# Patient Record
Sex: Female | Born: 1990 | Race: Black or African American | Hispanic: No | Marital: Single | Smoking: Current every day smoker
Health system: Southern US, Community
[De-identification: ages and names within clinical notes are randomized; demographics above are authoritative.]

## PROBLEM LIST (undated history)

## (undated) ENCOUNTER — Inpatient Hospital Stay (HOSPITAL_COMMUNITY): Payer: Self-pay

## (undated) ENCOUNTER — Emergency Department (HOSPITAL_COMMUNITY): Admission: EM | Payer: Medicaid Other | Source: Home / Self Care

## (undated) DIAGNOSIS — Z789 Other specified health status: Secondary | ICD-10-CM

## (undated) DIAGNOSIS — N920 Excessive and frequent menstruation with regular cycle: Secondary | ICD-10-CM

## (undated) DIAGNOSIS — N888 Other specified noninflammatory disorders of cervix uteri: Secondary | ICD-10-CM

## (undated) DIAGNOSIS — F1910 Other psychoactive substance abuse, uncomplicated: Secondary | ICD-10-CM

## (undated) HISTORY — PX: DILATION AND CURETTAGE OF UTERUS: SHX78

## (undated) HISTORY — PX: NO PAST SURGERIES: SHX2092

---
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
## 2008-03-11 ENCOUNTER — Ambulatory Visit: Payer: Self-pay | Admitting: Obstetrics

## 2008-04-25 ENCOUNTER — Emergency Department (HOSPITAL_COMMUNITY): Admission: EM | Admit: 2008-04-25 | Discharge: 2008-04-25 | Payer: Self-pay | Admitting: Emergency Medicine

## 2008-07-17 ENCOUNTER — Emergency Department (HOSPITAL_COMMUNITY): Admission: EM | Admit: 2008-07-17 | Discharge: 2008-07-17 | Payer: Self-pay | Admitting: Emergency Medicine

## 2008-08-21 ENCOUNTER — Emergency Department (HOSPITAL_COMMUNITY): Admission: EM | Admit: 2008-08-21 | Discharge: 2008-08-22 | Payer: Self-pay | Admitting: Emergency Medicine

## 2008-10-13 ENCOUNTER — Emergency Department (HOSPITAL_COMMUNITY): Admission: EM | Admit: 2008-10-13 | Discharge: 2008-10-13 | Payer: Self-pay | Admitting: Emergency Medicine

## 2009-08-10 ENCOUNTER — Emergency Department (HOSPITAL_COMMUNITY): Admission: EM | Admit: 2009-08-10 | Discharge: 2009-08-11 | Payer: Self-pay | Admitting: Emergency Medicine

## 2009-10-09 ENCOUNTER — Emergency Department (HOSPITAL_COMMUNITY): Admission: EM | Admit: 2009-10-09 | Discharge: 2009-10-09 | Payer: Self-pay | Admitting: Emergency Medicine

## 2010-02-02 ENCOUNTER — Emergency Department (HOSPITAL_COMMUNITY)
Admission: EM | Admit: 2010-02-02 | Discharge: 2010-02-02 | Payer: Self-pay | Source: Home / Self Care | Admitting: Emergency Medicine

## 2010-02-03 LAB — URINALYSIS, ROUTINE W REFLEX MICROSCOPIC
Bilirubin Urine: NEGATIVE
Ketones, ur: NEGATIVE mg/dL
Leukocytes, UA: NEGATIVE
Nitrite: NEGATIVE
Protein, ur: NEGATIVE mg/dL
Specific Gravity, Urine: 1.005 (ref 1.005–1.030)
Urine Glucose, Fasting: NEGATIVE mg/dL
Urobilinogen, UA: 0.2 mg/dL (ref 0.0–1.0)
pH: 7.5 (ref 5.0–8.0)

## 2010-02-03 LAB — BASIC METABOLIC PANEL
BUN: 6 mg/dL (ref 6–23)
CO2: 26 mEq/L (ref 19–32)
Calcium: 9.2 mg/dL (ref 8.4–10.5)
Chloride: 108 mEq/L (ref 96–112)
Creatinine, Ser: 0.67 mg/dL (ref 0.4–1.2)
GFR calc Af Amer: >60 mL/min (ref >60)
GFR calc non Af Amer: >60 mL/min (ref >60)
Glucose, Bld: 86 mg/dL (ref 70–99)
Potassium: 3.9 mEq/L (ref 3.5–5.1)
Sodium: 140 mEq/L (ref 135–145)

## 2010-02-03 LAB — CBC
HCT: 32.6 % — ABNORMAL LOW (ref 36.0–46.0)
Hemoglobin: 10 g/dL — ABNORMAL LOW (ref 12.0–15.0)
MCH: 24.8 pg — ABNORMAL LOW (ref 26.0–34.0)
MCHC: 30.7 g/dL (ref 30.0–36.0)
MCV: 80.7 fL (ref 78.0–100.0)
Platelets: 329 10*3/uL (ref 150–400)
RBC: 4.04 MIL/uL (ref 3.87–5.11)
RDW: 15.3 % (ref 11.5–15.5)
WBC: 7.6 10*3/uL (ref 4.0–10.5)

## 2010-02-03 LAB — DIFFERENTIAL
Basophils Absolute: 0 10*3/uL (ref 0.0–0.1)
Basophils Relative: 0 % (ref 0–1)
Eosinophils Absolute: 0.1 10*3/uL (ref 0.0–0.7)
Eosinophils Relative: 1 % (ref 0–5)
Lymphocytes Relative: 35 % (ref 12–46)
Lymphs Abs: 2.6 10*3/uL (ref 0.7–4.0)
Monocytes Absolute: 0.4 10*3/uL (ref 0.1–1.0)
Monocytes Relative: 6 % (ref 3–12)
Neutro Abs: 4.5 10*3/uL (ref 1.7–7.7)
Neutrophils Relative %: 59 % (ref 43–77)

## 2010-02-03 LAB — HCG, QUANTITATIVE, PREGNANCY: hCG, Beta Chain, Quant, S: 2 m[IU]/mL (ref <5)

## 2010-02-03 LAB — URINE MICROSCOPIC-ADD ON

## 2010-02-03 LAB — PREGNANCY, URINE: Preg Test, Ur: NEGATIVE

## 2010-04-03 LAB — URINE MICROSCOPIC-ADD ON

## 2010-04-03 LAB — COMPREHENSIVE METABOLIC PANEL
ALT: 16 U/L (ref 0–35)
AST: 22 U/L (ref 0–37)
Albumin: 4.1 g/dL (ref 3.5–5.2)
Alkaline Phosphatase: 56 U/L (ref 39–117)
BUN: 7 mg/dL (ref 6–23)
CO2: 23 mEq/L (ref 19–32)
Calcium: 9 mg/dL (ref 8.4–10.5)
Chloride: 104 mEq/L (ref 96–112)
Creatinine, Ser: 0.71 mg/dL (ref 0.4–1.2)
GFR calc Af Amer: >60 mL/min (ref >60)
GFR calc non Af Amer: >60 mL/min (ref >60)
Glucose, Bld: 89 mg/dL (ref 70–99)
Potassium: 3.3 mEq/L — ABNORMAL LOW (ref 3.5–5.1)
Sodium: 135 mEq/L (ref 135–145)
Total Bilirubin: 0.6 mg/dL (ref 0.3–1.2)
Total Protein: 7.6 g/dL (ref 6.0–8.3)

## 2010-04-03 LAB — CBC
HCT: 38.5 % (ref 36.0–46.0)
Hemoglobin: 12.9 g/dL (ref 12.0–15.0)
MCH: 29.2 pg (ref 26.0–34.0)
MCHC: 33.6 g/dL (ref 30.0–36.0)
MCV: 87 fL (ref 78.0–100.0)
Platelets: 298 10*3/uL (ref 150–400)
RBC: 4.43 MIL/uL (ref 3.87–5.11)
RDW: 13.8 % (ref 11.5–15.5)
WBC: 12.6 10*3/uL — ABNORMAL HIGH (ref 4.0–10.5)

## 2010-04-03 LAB — DIFFERENTIAL
Basophils Absolute: 0 10*3/uL (ref 0.0–0.1)
Basophils Relative: 0 % (ref 0–1)
Eosinophils Absolute: 0 10*3/uL (ref 0.0–0.7)
Eosinophils Relative: 0 % (ref 0–5)
Lymphocytes Relative: 9 % — ABNORMAL LOW (ref 12–46)
Lymphs Abs: 1.1 10*3/uL (ref 0.7–4.0)
Monocytes Absolute: 0.9 10*3/uL (ref 0.1–1.0)
Monocytes Relative: 7 % (ref 3–12)
Neutro Abs: 10.6 10*3/uL — ABNORMAL HIGH (ref 1.7–7.7)
Neutrophils Relative %: 84 % — ABNORMAL HIGH (ref 43–77)

## 2010-04-03 LAB — URINALYSIS, ROUTINE W REFLEX MICROSCOPIC
Glucose, UA: NEGATIVE mg/dL
Hgb urine dipstick: NEGATIVE
Ketones, ur: NEGATIVE mg/dL
Leukocytes, UA: NEGATIVE
Nitrite: NEGATIVE
Protein, ur: 30 mg/dL — AB
Specific Gravity, Urine: 1.03 (ref 1.005–1.030)
Urobilinogen, UA: 0.2 mg/dL (ref 0.0–1.0)
pH: 5.5 (ref 5.0–8.0)

## 2010-04-03 LAB — LIPASE, BLOOD: Lipase: 18 U/L (ref 11–59)

## 2010-04-03 LAB — POCT PREGNANCY, URINE: Preg Test, Ur: NEGATIVE

## 2010-04-23 LAB — POCT I-STAT, CHEM 8
BUN: 7 mg/dL (ref 6–23)
Calcium, Ion: 1.14 mmol/L (ref 1.12–1.32)
Chloride: 107 mEq/L (ref 96–112)
Creatinine, Ser: 0.7 mg/dL (ref 0.4–1.2)
Glucose, Bld: 84 mg/dL (ref 70–99)
HCT: 37 % (ref 36.0–46.0)
Hemoglobin: 12.6 g/dL (ref 12.0–15.0)
Potassium: 3.9 mEq/L (ref 3.5–5.1)
Sodium: 142 mEq/L (ref 135–145)
TCO2: 26 mmol/L (ref 0–100)

## 2010-04-23 LAB — COMPREHENSIVE METABOLIC PANEL
ALT: 29 U/L (ref 0–35)
AST: 24 U/L (ref 0–37)
Albumin: 3.8 g/dL (ref 3.5–5.2)
Alkaline Phosphatase: 71 U/L (ref 39–117)
BUN: 7 mg/dL (ref 6–23)
CO2: 28 mEq/L (ref 19–32)
Calcium: 9.2 mg/dL (ref 8.4–10.5)
Chloride: 108 mEq/L (ref 96–112)
Creatinine, Ser: 0.65 mg/dL (ref 0.4–1.2)
GFR calc Af Amer: >60 mL/min (ref >60)
GFR calc non Af Amer: >60 mL/min (ref >60)
Glucose, Bld: 82 mg/dL (ref 70–99)
Potassium: 4 mEq/L (ref 3.5–5.1)
Sodium: 141 mEq/L (ref 135–145)
Total Bilirubin: 0.2 mg/dL — ABNORMAL LOW (ref 0.3–1.2)
Total Protein: 7.6 g/dL (ref 6.0–8.3)

## 2010-04-23 LAB — URINALYSIS, ROUTINE W REFLEX MICROSCOPIC
Bilirubin Urine: NEGATIVE
Glucose, UA: NEGATIVE mg/dL
Hgb urine dipstick: NEGATIVE
Ketones, ur: NEGATIVE mg/dL
Leukocytes, UA: NEGATIVE
Nitrite: NEGATIVE
Protein, ur: 30 mg/dL — AB
Specific Gravity, Urine: 1.028 (ref 1.005–1.030)
Urobilinogen, UA: 0.2 mg/dL (ref 0.0–1.0)
pH: 8 (ref 5.0–8.0)

## 2010-04-23 LAB — URINE MICROSCOPIC-ADD ON

## 2010-04-23 LAB — LIPASE, BLOOD: Lipase: 19 U/L (ref 11–59)

## 2010-04-23 LAB — POCT PREGNANCY, URINE: Preg Test, Ur: NEGATIVE

## 2010-04-24 LAB — POCT PREGNANCY, URINE: Preg Test, Ur: NEGATIVE

## 2010-04-24 LAB — CBC
MCV: 83.2 fL (ref 78.0–100.0)
RBC: 4.03 MIL/uL (ref 3.87–5.11)
WBC: 19.5 10*3/uL — ABNORMAL HIGH (ref 4.0–10.5)

## 2010-04-24 LAB — URINALYSIS, ROUTINE W REFLEX MICROSCOPIC
Glucose, UA: NEGATIVE mg/dL
Ketones, ur: NEGATIVE mg/dL
pH: 6 (ref 5.0–8.0)

## 2010-04-24 LAB — BASIC METABOLIC PANEL
Chloride: 105 mEq/L (ref 96–112)
Creatinine, Ser: 0.54 mg/dL (ref 0.4–1.2)
GFR calc Af Amer: >60 mL/min (ref >60)
Potassium: 3.3 mEq/L — ABNORMAL LOW (ref 3.5–5.1)
Sodium: 135 mEq/L (ref 135–145)

## 2010-04-25 LAB — URINALYSIS, ROUTINE W REFLEX MICROSCOPIC
Hgb urine dipstick: NEGATIVE
Nitrite: NEGATIVE
Specific Gravity, Urine: 1.021 (ref 1.005–1.030)
Urobilinogen, UA: 0.2 mg/dL (ref 0.0–1.0)

## 2010-04-25 LAB — POCT PREGNANCY, URINE: Preg Test, Ur: NEGATIVE

## 2010-04-28 LAB — URINALYSIS, ROUTINE W REFLEX MICROSCOPIC
Ketones, ur: NEGATIVE mg/dL
Nitrite: NEGATIVE
Protein, ur: NEGATIVE mg/dL

## 2010-08-01 ENCOUNTER — Emergency Department (HOSPITAL_COMMUNITY)
Admission: EM | Admit: 2010-08-01 | Discharge: 2010-08-01 | Disposition: A | Discharge status: Home / Self Care | Payer: Self-pay | Attending: Emergency Medicine | Admitting: Emergency Medicine

## 2010-08-01 DIAGNOSIS — O039 Complete or unspecified spontaneous abortion without complication: Secondary | ICD-10-CM | POA: Insufficient documentation

## 2010-08-01 DIAGNOSIS — N898 Other specified noninflammatory disorders of vagina: Secondary | ICD-10-CM | POA: Insufficient documentation

## 2010-08-01 DIAGNOSIS — J45909 Unspecified asthma, uncomplicated: Secondary | ICD-10-CM | POA: Insufficient documentation

## 2010-08-01 LAB — URINALYSIS, ROUTINE W REFLEX MICROSCOPIC
Bilirubin Urine: NEGATIVE
Hgb urine dipstick: NEGATIVE
Ketones, ur: 15 mg/dL — AB
Nitrite: NEGATIVE
Urobilinogen, UA: 0.2 mg/dL (ref 0.0–1.0)
pH: 6 (ref 5.0–8.0)

## 2010-08-01 LAB — CBC
MCH: 25.1 pg — ABNORMAL LOW (ref 26.0–34.0)
MCV: 77.3 fL — ABNORMAL LOW (ref 78.0–100.0)
Platelets: 369 10*3/uL (ref 150–400)
RDW: 16.6 % — ABNORMAL HIGH (ref 11.5–15.5)

## 2010-09-07 ENCOUNTER — Emergency Department (HOSPITAL_COMMUNITY)
Admission: EM | Admit: 2010-09-07 | Discharge: 2010-09-07 | Disposition: A | Discharge status: Home / Self Care | Payer: Self-pay | Attending: Emergency Medicine | Admitting: Emergency Medicine

## 2010-09-07 DIAGNOSIS — K089 Disorder of teeth and supporting structures, unspecified: Secondary | ICD-10-CM | POA: Insufficient documentation

## 2010-09-07 DIAGNOSIS — R509 Fever, unspecified: Secondary | ICD-10-CM | POA: Insufficient documentation

## 2010-09-07 DIAGNOSIS — R110 Nausea: Secondary | ICD-10-CM | POA: Insufficient documentation

## 2010-09-07 LAB — BASIC METABOLIC PANEL WITH GFR
BUN: 7 mg/dL (ref 6–23)
CO2: 29 meq/L (ref 19–32)
Calcium: 9.5 mg/dL (ref 8.4–10.5)
Chloride: 99 meq/L (ref 96–112)
Creatinine, Ser: 0.6 mg/dL (ref 0.50–1.10)
GFR calc Af Amer: >60 mL/min
GFR calc non Af Amer: >60 mL/min
Glucose, Bld: 98 mg/dL (ref 70–99)
Potassium: 3.7 meq/L (ref 3.5–5.1)
Sodium: 135 meq/L (ref 135–145)

## 2010-09-07 LAB — CBC
HCT: 32.8 % — ABNORMAL LOW (ref 36.0–46.0)
MCH: 25.5 pg — ABNORMAL LOW (ref 26.0–34.0)
MCV: 81.2 fL (ref 78.0–100.0)
Platelets: 315 10*3/uL (ref 150–400)
RBC: 4.04 MIL/uL (ref 3.87–5.11)

## 2010-09-07 LAB — DIFFERENTIAL
Eosinophils Absolute: 0.1 10*3/uL (ref 0.0–0.7)
Lymphocytes Relative: 39 % (ref 12–46)
Lymphs Abs: 3.4 10*3/uL (ref 0.7–4.0)
Monocytes Relative: 7 % (ref 3–12)
Neutrophils Relative %: 52 % (ref 43–77)

## 2010-09-18 ENCOUNTER — Emergency Department (HOSPITAL_COMMUNITY)
Admission: EM | Admit: 2010-09-18 | Discharge: 2010-09-18 | Disposition: A | Discharge status: Home / Self Care | Payer: Self-pay | Attending: Emergency Medicine | Admitting: Emergency Medicine

## 2010-09-18 DIAGNOSIS — F1910 Other psychoactive substance abuse, uncomplicated: Secondary | ICD-10-CM | POA: Insufficient documentation

## 2010-09-18 DIAGNOSIS — R4182 Altered mental status, unspecified: Secondary | ICD-10-CM | POA: Insufficient documentation

## 2010-09-18 DIAGNOSIS — F1010 Alcohol abuse, uncomplicated: Secondary | ICD-10-CM | POA: Insufficient documentation

## 2010-09-18 LAB — RAPID URINE DRUG SCREEN, HOSP PERFORMED
Amphetamines: NOT DETECTED
Barbiturates: NOT DETECTED
Benzodiazepines: POSITIVE — AB
Tetrahydrocannabinol: POSITIVE — AB

## 2010-09-18 LAB — CBC
HCT: 30.4 % — ABNORMAL LOW (ref 36.0–46.0)
Hemoglobin: 9.7 g/dL — ABNORMAL LOW (ref 12.0–15.0)
MCH: 25.7 pg — ABNORMAL LOW (ref 26.0–34.0)
MCHC: 31.9 g/dL (ref 30.0–36.0)
RBC: 3.78 MIL/uL — ABNORMAL LOW (ref 3.87–5.11)

## 2010-09-18 LAB — COMPREHENSIVE METABOLIC PANEL
ALT: 11 U/L (ref 0–35)
AST: 17 U/L (ref 0–37)
Albumin: 3.2 g/dL — ABNORMAL LOW (ref 3.5–5.2)
Alkaline Phosphatase: 41 U/L (ref 39–117)
Calcium: 7.7 mg/dL — ABNORMAL LOW (ref 8.4–10.5)
Potassium: 3.4 mEq/L — ABNORMAL LOW (ref 3.5–5.1)
Sodium: 142 mEq/L (ref 135–145)
Total Protein: 6.4 g/dL (ref 6.0–8.3)

## 2010-09-18 LAB — POCT PREGNANCY, URINE: Preg Test, Ur: NEGATIVE

## 2011-01-18 NOTE — L&D Delivery Note (Signed)
Delivery Note At 2:02 AM a viable female was delivered via Vaginal, Spontaneous Delivery (Presentation:Vertex, ROA).  APGAR:8 ,9 ; weight is pending .   Placenta status: spontaneous, intact .  Cord: 3 vessels, with the following complications: none  Anesthesia: Epidural  Episiotomy: none Lacerations: none Est. Blood Loss (mL): 150  Mom to postpartum.  Baby to nursery-stable.  Ginger Organ E 01/03/2012, 2:18 AM

## 2011-01-18 NOTE — L&D Delivery Note (Signed)
I was present for entire delivery and agree with above note.  Napoleon Form, MD

## 2011-04-02 ENCOUNTER — Emergency Department (HOSPITAL_COMMUNITY)
Admission: EM | Admit: 2011-04-02 | Discharge: 2011-04-02 | Disposition: A | Discharge status: Home / Self Care | Payer: Self-pay | Attending: Emergency Medicine | Admitting: Emergency Medicine

## 2011-04-02 ENCOUNTER — Encounter (HOSPITAL_COMMUNITY): Payer: Self-pay | Admitting: *Deleted

## 2011-04-02 DIAGNOSIS — F17200 Nicotine dependence, unspecified, uncomplicated: Secondary | ICD-10-CM | POA: Insufficient documentation

## 2011-04-02 DIAGNOSIS — S61519A Laceration without foreign body of unspecified wrist, initial encounter: Secondary | ICD-10-CM

## 2011-04-02 DIAGNOSIS — Y998 Other external cause status: Secondary | ICD-10-CM | POA: Insufficient documentation

## 2011-04-02 DIAGNOSIS — Y9389 Activity, other specified: Secondary | ICD-10-CM | POA: Insufficient documentation

## 2011-04-02 DIAGNOSIS — S61509A Unspecified open wound of unspecified wrist, initial encounter: Secondary | ICD-10-CM | POA: Insufficient documentation

## 2011-04-02 DIAGNOSIS — W278XXA Contact with other nonpowered hand tool, initial encounter: Secondary | ICD-10-CM | POA: Insufficient documentation

## 2011-04-02 MED ORDER — TRAMADOL HCL 50 MG PO TABS
50.0000 mg | ORAL_TABLET | Freq: Once | ORAL | Status: AC
  Administered 2011-04-02: 50 mg via ORAL
  Filled 2011-04-02: qty 1

## 2011-04-02 MED ORDER — HYDROCODONE-ACETAMINOPHEN 5-500 MG PO TABS: 1.0000 | ORAL_TABLET | Freq: Four times a day (QID) | ORAL | Status: AC | PRN

## 2011-04-02 NOTE — ED Notes (Signed)
Reports jumping on her bed last night and accidentally got a cut on left anterior wrist from a pair of scissors that were on the bed. Had called ems at the time and told that it didn't need tx, bandaged pta. No bleeding noted at triage.

## 2011-04-02 NOTE — Discharge Instructions (Signed)
Laceration Care, Adult A laceration is a cut that goes through all layers of the skin. The cut goes into the tissue beneath the skin. HOME CARE For stitches (sutures) or staples:  Keep the cut clean and dry.   If you have a bandage (dressing), change it at least once a day. Change the bandage if it gets wet or dirty, or as told by your doctor.   Wash the cut with soap and water 2 times a day. Rinse the cut with water. Pat it dry with a clean towel.   Put a thin layer of medicated cream on the cut as told by your doctor.   You may shower after the first 24 hours. Do not soak the cut in water until the stitches are removed.   Only take medicines as told by your doctor.   Have your stitches or staples removed as told by your doctor.  For skin adhesive strips:  Keep the cut clean and dry.   Do not get the strips wet. You may take a bath, but be careful to keep the cut dry.   If the cut gets wet, pat it dry with a clean towel.   The strips will fall off on their own. Do not remove the strips that are still stuck to the cut.  For wound glue:  You may shower or take baths. Do not soak or scrub the cut. Do not swim. Avoid heavy sweating until the glue falls off on its own. After a shower or bath, pat the cut dry with a clean towel.   Do not put medicine on your cut until the glue falls off.   If you have a bandage, do not put tape over the glue.   Avoid lots of sunlight or tanning lamps until the glue falls off. Put sunscreen on the cut for the first year to reduce your scar.   The glue will fall off on its own. Do not pick at the glue.  You may need a tetanus shot if:  You cannot remember when you had your last tetanus shot.   You have never had a tetanus shot.  If you need a tetanus shot and you choose not to have one, you may get tetanus. Sickness from tetanus can be serious. GET HELP RIGHT AWAY IF:   Your pain does not get better with medicine.   Your arm, hand, leg, or  foot loses feeling (numbness) or changes color.   Your cut is bleeding.   Your joint feels weak, or you cannot use your joint.   You have painful lumps on your body.   Your cut is red, puffy (swollen), or painful.   You have a red line on the skin near the cut.   You have yellowish-white fluid (pus) coming from the cut.   You have a fever.   You have a bad smell coming from the cut or bandage.   Your cut breaks open before or after stitches are removed.   You notice something coming out of the cut, such as wood or glass.   You cannot move a finger or toe.  MAKE SURE YOU:   Understand these instructions.   Will watch your condition.   Will get help right away if you are not doing well or get worse.  Document Released: 06/22/2007 Document Revised: 12/23/2010 Document Reviewed: 06/29/2010 [facility] Patient Information 2012 [place], [place].     Sutures out in 7 days.

## 2011-04-02 NOTE — ED Provider Notes (Signed)
History     CSN: [id number]  Arrival date & time 04/02/11  1133   First MD Initiated Contact with Patient 04/02/11 1204      Chief Complaint  Patient presents with  . Laceration    (Consider location/radiation/quality/duration/timing/severity/associated sxs/prior treatment) Patient is a 21 y.o. female presenting with skin laceration. The history is provided by the patient.  Laceration  Incident onset: at 1 AM. Pain location: left wrist. The laceration is 3 cm in size. Injury mechanism: States was jumping on the bed and landed on a pair of scissors, causing two lacerations.   The pain is severe. The pain has been constant since onset. She reports no foreign bodies present. Her tetanus status is unknown.    History reviewed. No pertinent past medical history.  History reviewed. No pertinent past surgical history.  History reviewed. No pertinent family history.  History  Substance Use Topics  . Smoking status: Current Everyday Smoker -- 1.0 packs/day    Types: Cigarettes  . Smokeless tobacco: Not on file  . Alcohol Use: Yes     occ    OB History    Grav Para Term Preterm Abortions TAB SAB Ect Mult Living                  Review of Systems  All other systems reviewed and are negative.    Allergies  Review of patient's allergies indicates no known allergies.  Home Medications  No current outpatient prescriptions on file.  BP 110/64  Pulse 87  Temp(Src) 98.7 F (37.1 C) (Oral)  Resp 20  SpO2 100%  LMP 03/18/2011  Physical Exam  Nursing note and vitals reviewed. Constitutional: She is oriented to person, place, and time. She appears well-developed and well-nourished.  HENT:  Head: Normocephalic and atraumatic.  Neck: Normal range of motion. Neck supple.  Musculoskeletal:       There are two lacerations to the left wrist.  The proximal laceration is superficial and does not require repair.  The more distal extends into the subcutaneous tissue.  There is no  arterial, tendon, or nerve involvement.  Neurological: She is alert and oriented to person, place, and time.  Skin: Skin is warm and dry.    ED Course  Procedures (including critical care time)  Labs Reviewed - No data to display No results found.   No diagnosis found.  LACERATION REPAIR Performed by: Geoffery Lyons Authorized by: Geoffery Lyons Consent: Verbal consent obtained. Risks and benefits: risks, benefits and alternatives were discussed Consent given by: patient Patient identity confirmed: provided demographic data Prepped and Draped in normal sterile fashion Wound explored  Laceration Location: Left wrist  Laceration Length: 3.5 cm  No Foreign Bodies seen or palpated  Anesthesia: local infiltration  Local anesthetic: lidocaine 2% without epinephrine  Anesthetic total: 3 ml  Irrigation method: syringe Amount of cleaning: standard  Skin closure: 5-0 prolene  Number of sutures: 5  Technique: simple interrupted.  Patient tolerance: Patient tolerated the procedure well with no immediate complications.  MDM          Geoffery Lyons, MD 04/02/11 1246

## 2011-09-08 ENCOUNTER — Inpatient Hospital Stay (HOSPITAL_COMMUNITY): Payer: Self-pay

## 2011-09-08 ENCOUNTER — Encounter (HOSPITAL_COMMUNITY): Payer: Self-pay | Admitting: Emergency Medicine

## 2011-09-08 ENCOUNTER — Inpatient Hospital Stay (HOSPITAL_COMMUNITY)
Admission: AD | Admit: 2011-09-08 | Discharge: 2011-09-08 | Disposition: A | Discharge status: Home / Self Care | Payer: Self-pay | Source: Ambulatory Visit | Attending: Obstetrics & Gynecology | Admitting: Obstetrics & Gynecology

## 2011-09-08 ENCOUNTER — Emergency Department (HOSPITAL_COMMUNITY)
Admission: EM | Admit: 2011-09-08 | Discharge: 2011-09-08 | Disposition: A | Discharge status: Home / Self Care | Payer: Self-pay | Attending: Emergency Medicine | Admitting: Emergency Medicine

## 2011-09-08 ENCOUNTER — Encounter (HOSPITAL_COMMUNITY): Payer: Self-pay | Admitting: *Deleted

## 2011-09-08 DIAGNOSIS — N949 Unspecified condition associated with female genital organs and menstrual cycle: Secondary | ICD-10-CM | POA: Insufficient documentation

## 2011-09-08 DIAGNOSIS — K029 Dental caries, unspecified: Secondary | ICD-10-CM | POA: Insufficient documentation

## 2011-09-08 DIAGNOSIS — A499 Bacterial infection, unspecified: Secondary | ICD-10-CM

## 2011-09-08 DIAGNOSIS — M549 Dorsalgia, unspecified: Secondary | ICD-10-CM | POA: Insufficient documentation

## 2011-09-08 DIAGNOSIS — O99891 Other specified diseases and conditions complicating pregnancy: Secondary | ICD-10-CM | POA: Insufficient documentation

## 2011-09-08 DIAGNOSIS — F17200 Nicotine dependence, unspecified, uncomplicated: Secondary | ICD-10-CM | POA: Insufficient documentation

## 2011-09-08 DIAGNOSIS — Z3490 Encounter for supervision of normal pregnancy, unspecified, unspecified trimester: Secondary | ICD-10-CM

## 2011-09-08 DIAGNOSIS — N760 Acute vaginitis: Secondary | ICD-10-CM

## 2011-09-08 DIAGNOSIS — K089 Disorder of teeth and supporting structures, unspecified: Secondary | ICD-10-CM | POA: Insufficient documentation

## 2011-09-08 DIAGNOSIS — R109 Unspecified abdominal pain: Secondary | ICD-10-CM | POA: Insufficient documentation

## 2011-09-08 LAB — URINALYSIS, DIPSTICK ONLY
Bilirubin Urine: NEGATIVE
Glucose, UA: NEGATIVE mg/dL
Hgb urine dipstick: NEGATIVE
Ketones, ur: NEGATIVE mg/dL
Nitrite: NEGATIVE
Protein, ur: NEGATIVE mg/dL
Specific Gravity, Urine: 1.022 (ref 1.005–1.030)
Urobilinogen, UA: 1 mg/dL (ref 0.0–1.0)
pH: 6 (ref 5.0–8.0)

## 2011-09-08 LAB — WET PREP, GENITAL
Trich, Wet Prep: NONE SEEN
Yeast Wet Prep HPF POC: NONE SEEN

## 2011-09-08 LAB — POCT PREGNANCY, URINE: Preg Test, Ur: POSITIVE — AB

## 2011-09-08 MED ORDER — PENICILLIN V POTASSIUM 500 MG PO TABS: 500.0000 mg | ORAL_TABLET | Freq: Four times a day (QID) | ORAL | Status: AC

## 2011-09-08 MED ORDER — METRONIDAZOLE 500 MG PO TABS: 2000.0000 mg | ORAL_TABLET | Freq: Once | ORAL | Status: AC

## 2011-09-08 NOTE — MAU Provider Note (Signed)
History     CSN: [id number]  Arrival date and time: 09/08/11 1939   None     Chief Complaint  Patient presents with  . Possible Pregnancy   HPI 21 y.o. G2P0 here to confirm pregnancy and dates.  Pt states she was at [facility] this morning and had positive urine pregnancy test and bedside ultrasound confirming intrauterine pregnancy.  She has no insurance and has had no prenatal care.  States she has had abdominal muscle episodic tensing with pain for the past month that occurs mostly at night when asked about contractions.  Denies vaginal bleeding or leakage of fluid.  Has prenatal vitamins but has not taken because cause constipation.  Reports severe back pain 3-4 weeks, denies nausea/vomiting/chest pain/shortness of breath.  Of note, pt has had h/o abnormal pap with LEEP and cervical polyp with biopsy.  Pt also concerned about STI check because she has had 2 "questionable" partners with no condom and multiple other partners with condoms in the past 3 years.  She has had some increased discharge and used monastat for assumed yeast infection with no improvement in discharge 3 d ago.   History reviewed. No pertinent past medical history. Previous pregnancy with elective abortion.  History reviewed. No pertinent past surgical history.  Family history includes mother had stroke due to "left carotid artery kink" at 23 yo, hole in heart, c-section due to genital HSV, cervical cancer with hysterectomy  History  Substance Use Topics  . Smoking status: Current Everyday Smoker -- 0.5 packs/day    Types: Cigarettes  . Smokeless tobacco: Not on file  . Alcohol Use: No     occ  Continues smoking cigarettes. Lives with mother and people smoke in the house.  Allergies: No Known Allergies  Prescriptions prior to admission  Medication Sig Dispense Refill  . naproxen sodium (ANAPROX) 220 MG tablet Take 220 mg by mouth 2 (two) times daily as needed. For pain.        ROS Physical Exam   Blood  pressure 119/69, pulse 69, temperature 98.1 F (36.7 C), temperature source Oral, resp. rate 20, height 5\' 5"  (1.651 m), weight 76.204 kg (168 lb), last menstrual period 03/23/2011, SpO2 100.00%.  Physical Exam GEN: well-nourished in NAD HEENT: Right lower molar decayed but no erythema or exudate seen CV: RRR PULM: CTAB ABD: nontender, nondistended, gravid, NABS EXTR: no bilateral LE edema PELVIC exam: copious thin whitish malodorous fluid; cervix with large cyst anteriorly; cervix is anterior; closed, thick, long; small scar on cervix   MAU Course  Procedures  MDM  - Wet prep: few clue cells - GC/Chlamydia: pending  Assessment and Plan  21 y.o. G2P0 here to confirm pregnancy and dates.   1. Intrauterine pregnancy - confirmed by complete US here showing dates at 25 weeks (due date 12/22/11), no placenta previa, normal amt of fluid - Gave information about [facility] and strongly recommended establishing prenatal care and following up on applying for medicaid - Strongly recommended taking PNV or childrens multivitamin with prune juice or colace for constipation - did not want colace  - Strongly recommended stopping smoking and recommended quit line - pt did not wish to use - Given leep history potential for cervical insufficiency - recommend f/u on records at where she gets prenatal care  2. Tooth decay with pain and no fever -  - Prescribed penicillin VK 500 mg QID x 10 days - gave dental letter  3. Vaginal discharge - Wet prep and GC/Chlamydia  done, pt with concerns about STI - f/u GC/Chlamydia and call pt if abnormal - Prescribed metronidazole 2g PO x 1 for BV, recommended no alcohol use    Simone Curia 09/08/2011, 11:13 PM   Pt seen and examined by me also. Agree with note. Wynelle Bourgeois CNM

## 2011-09-08 NOTE — MAU Note (Signed)
Pt states she was at [facility] ED earlier today and had an [place] but  Does not know how far along she is and what the next step is

## 2011-09-08 NOTE — ED Notes (Signed)
States having abd pain, back pain, NO period since March, states thinks she is pregnant, has put on wt, feeling movement in abd- breast tenderness,

## 2011-09-09 LAB — GC/CHLAMYDIA PROBE AMP, GENITAL: GC Probe Amp, Genital: NEGATIVE

## 2011-09-11 NOTE — ED Provider Notes (Signed)
History    Crampy lower abdominal pain and back pain. Patient thinks she may be pregnant. Her last menstrual period was sometime in the middle to end of March. The past several months she has been gaining weight. She has had breast enlargement and tenderness. Is no bleeding. No leakage of fluid. No urinary complaints. Feels she can feel a baby moving.   CSN: [id number]  Arrival date & time 09/08/11  7829   First MD Initiated Contact with Patient 09/08/11 [phone]      Chief Complaint  Patient presents with  . Abdominal Pain  . Back Pain    (Consider location/radiation/quality/duration/timing/severity/associated sxs/prior treatment) HPI  History reviewed. No pertinent past medical history.  History reviewed. No pertinent past surgical history.  No family history on file.  History  Substance Use Topics  . Smoking status: Current Everyday Smoker -- 0.5 packs/day    Types: Cigarettes  . Smokeless tobacco: Not on file  . Alcohol Use: No     occ    OB History    Grav Para Term Preterm Abortions TAB SAB Ect Mult Living   1               Review of Systems   Review of symptoms negative unless otherwise noted in HPI.   Allergies  Review of patient's allergies indicates no known allergies.  Home Medications   Current Outpatient Rx  Name Route Sig Dispense Refill  . NAPROXEN SODIUM 220 MG PO TABS Oral Take 220 mg by mouth 2 (two) times daily as needed. For pain.    Marland Kitchen METRONIDAZOLE 500 MG PO TABS Oral Take 4 tablets (2,000 mg total) by mouth once. 4 tablet 0    No alcohol intake with this medication.  Marland Kitchen PENICILLIN V POTASSIUM 500 MG PO TABS Oral Take 1 tablet (500 mg total) by mouth 4 (four) times daily. 40 tablet 0    BP 110/70  Pulse 82  Temp 98.3 F (36.8 C) (Oral)  Resp 18  SpO2 98%  LMP 03/23/2011  Physical Exam  Nursing note and vitals reviewed. Constitutional: She appears well-developed and well-nourished. No distress.  HENT:  Head: Normocephalic and  atraumatic.  Eyes: Conjunctivae are normal. Right eye exhibits no discharge. Left eye exhibits no discharge.  Neck: Neck supple.  Cardiovascular: Normal rate, regular rhythm and normal heart sounds.  Exam reveals no gallop and no friction rub.   No murmur heard. Pulmonary/Chest: Effort normal and breath sounds normal. No respiratory distress.  Abdominal: Soft. There is no tenderness.       gravid uterus with fundus palpable just above umbilicus. Bedside US with large fetus with FHT in 140s-150s  Musculoskeletal: She exhibits no edema and no tenderness.  Neurological: She is alert.  Skin: Skin is warm and dry.  Psychiatric: She has a normal mood and affect. Her behavior is normal. Thought content normal.    ED Course  Procedures (including critical care time)  Labs Reviewed  URINALYSIS, DIPSTICK ONLY - Abnormal; Notable for the following:    Leukocytes, UA MODERATE (*)     All other components within normal limits  POCT PREGNANCY, URINE - Abnormal; Notable for the following:    Preg Test, Ur POSITIVE (*)     All other components within normal limits  LAB REPORT - SCANNED   No results found.   1. Pregnancy       MDM  21 year old female with mild lower abdominal pain and back pain. Patient has not had a  menstrual period since March. Exam is consistent with a pregnancy well into second trimester. Uterine fundus is palpable above the umbilicus. BEdside US with an IUP with fetal heart tones in the 140s. Pt is in need of OB followup. This was provided. Return precautions were discussed.     Raeford Razor, MD 09/11/11 [phone]

## 2011-10-02 ENCOUNTER — Encounter (HOSPITAL_COMMUNITY): Payer: Self-pay | Admitting: Obstetrics and Gynecology

## 2011-10-02 ENCOUNTER — Inpatient Hospital Stay (HOSPITAL_COMMUNITY)
Admission: AD | Admit: 2011-10-02 | Discharge: 2011-10-02 | Disposition: A | Discharge status: Hospice | Payer: Medicaid Other | Source: Ambulatory Visit | Attending: Obstetrics & Gynecology | Admitting: Obstetrics & Gynecology

## 2011-10-02 DIAGNOSIS — O0930 Supervision of pregnancy with insufficient antenatal care, unspecified trimester: Secondary | ICD-10-CM

## 2011-10-02 DIAGNOSIS — B373 Candidiasis of vulva and vagina: Secondary | ICD-10-CM

## 2011-10-02 DIAGNOSIS — O99891 Other specified diseases and conditions complicating pregnancy: Secondary | ICD-10-CM | POA: Insufficient documentation

## 2011-10-02 DIAGNOSIS — L293 Anogenital pruritus, unspecified: Secondary | ICD-10-CM | POA: Insufficient documentation

## 2011-10-02 HISTORY — DX: Other specified health status: Z78.9

## 2011-10-02 LAB — URINALYSIS, ROUTINE W REFLEX MICROSCOPIC
Bilirubin Urine: NEGATIVE
Hgb urine dipstick: NEGATIVE
Protein, ur: NEGATIVE mg/dL
Urobilinogen, UA: 0.2 mg/dL (ref 0.0–1.0)

## 2011-10-02 LAB — WET PREP, GENITAL: Trich, Wet Prep: NONE SEEN

## 2011-10-02 MED ORDER — FLUCONAZOLE 150 MG PO TABS: 150.0000 mg | ORAL_TABLET | Freq: Once | ORAL | Status: AC

## 2011-10-02 MED ORDER — GNP PRENATAL VITAMINS 28-0.8 MG PO TABS: 1.0000 | ORAL_TABLET | Freq: Every day | ORAL | Status: DC

## 2011-10-02 MED ORDER — FLUCONAZOLE 150 MG PO TABS
150.0000 mg | ORAL_TABLET | Freq: Once | ORAL | Status: AC
  Administered 2011-10-02: 150 mg via ORAL
  Filled 2011-10-02: qty 1

## 2011-10-02 NOTE — MAU Provider Note (Signed)
Chief Complaint:  Vaginal Itching   First Provider Initiated Contact with Patient 10/02/11 1549      HPI: [name] [name] is a 21 y.o. G6P0050 at [redacted]w[redacted]d with no PNC who presents to maternity admissions reporting vaginal itching, burning and thick yellow discharge.  She was seen here on 8/22 for abdominal pain and had clue cells on wet prep although no complaint of discharge. She was treated with metronidazole x1.  About 5 days later, she developed initial vaginal itching. Since that time it has worsened.  She denies fevers, chills, nausea, vomiting, diarrhea or abdominal pain.  Denies contractions, leakage of fluid or vaginal bleeding. Good fetal movement.   Pregnancy Course:   Past Medical History: Past Medical History  Diagnosis Date  . No pertinent past medical history     Past obstetric history:     OB History    Grav Para Term Preterm Abortions TAB SAB Ect Mult Living   6 0 0 0 5 1 4 0 0 0      # Outc Date GA Lbr Len/2nd Wgt Sex Del Anes PTL Lv   1 SAB            2 SAB            3 SAB            4 SAB            5 TAB            6 CUR               Past Surgical History: Past Surgical History  Procedure Date  . No past surgeries     Family History: History reviewed. No pertinent family history.  Social History: History  Substance Use Topics  . Smoking status: Current Some Day Smoker -- 0.5 packs/day    Types: Cigarettes  . Smokeless tobacco: Not on file  . Alcohol Use: No     occ    Allergies: No Known Allergies  Meds:  Prescriptions prior to admission  Medication Sig Dispense Refill  . miconazole (MONISTAT-3) KIT Place 1 each vaginally at bedtime.      . naproxen sodium (ANAPROX) 220 MG tablet Take 220 mg by mouth 2 (two) times daily as needed. For pain.        ROS: Pertinent findings in history of present illness.  Physical Exam  Blood pressure 123/58, pulse 89, temperature 98.5 F (36.9 C), temperature source Oral, resp. rate 18, height 5\' 3"   (1.6 m), weight 75.479 kg (166 lb 6.4 oz), last menstrual period 03/23/2011. GENERAL: Well-developed, well-nourished female in no acute distress.  HEENT: normocephalic HEART: normal rate RESP: normal effort ABDOMEN: Soft, non-tender, gravid appropriate for gestational age EXTREMITIES: Nontender, no edema NEURO: alert and oriented SPECULUM EXAM: NEFG, thick yellow yeast like discharge, erythematous vaginal vault, no blood, cervix clean  no CMT Dilation: Closed Effacement (%): Thick Cervical Position: Posterior Exam by:: Dr. Reola Calkins   FHT:  Baseline 140s , moderate variability, accelerations present, no decelerations Contractions: quiet   Labs: Results for orders placed during the hospital encounter of 10/02/11 (from the past 24 hour(s))  URINALYSIS, ROUTINE W REFLEX MICROSCOPIC     Status: Abnormal   Collection Time   10/02/11  1:50 PM      Component Value Range   Color, Urine YELLOW  YELLOW   APPearance CLEAR  CLEAR   Specific Gravity, Urine 1.015  1.005 - 1.030   pH 8.0  5.0 - 8.0   Glucose, UA NEGATIVE  NEGATIVE mg/dL   Hgb urine dipstick NEGATIVE  NEGATIVE   Bilirubin Urine NEGATIVE  NEGATIVE   Ketones, ur NEGATIVE  NEGATIVE mg/dL   Protein, ur NEGATIVE  NEGATIVE mg/dL   Urobilinogen, UA 0.2  0.0 - 1.0 mg/dL   Nitrite NEGATIVE  NEGATIVE   Leukocytes, UA SMALL (*) NEGATIVE  URINE MICROSCOPIC-ADD ON     Status: Normal   Collection Time   10/02/11  1:50 PM      Component Value Range   Squamous Epithelial / LPF RARE  RARE   WBC, UA 3-6  <3 WBC/hpf   RBC / HPF 0-2  <3 RBC/hpf   Bacteria, UA RARE  RARE    Imaging:  US Ob Detail + 14 Wk  09/09/2011  OBSTETRICAL ULTRASOUND: This exam was performed within a [facility]. The OB US report was generated in the [facility] system, and faxed to the ordering physician.   This report is also available in [employer] and in the [employer]. See [facility] Obstetric US report.   ED Course   Assessment: No  diagnosis found.  Plan:  1) vaginal discharge - s/p abx. Likely yeast infection - wet prep showing moderate yeast and WBCs Diflucan 150mg  x1 today and RX of or a second if no improvement in symptoms by day 3 - GC/Chl sent - no CMT today on exam   2) fetal well being - category I tracing   Strongly urged need for routine PNC.  Pt is already registered with social services but has yet to call the health department. This was discussed in great length and she was also encouraged to start back onto PNV as she had stopped taking them.   Discharge home Labor precautions and fetal kick counts    Medication List     As of 10/02/2011  3:56 PM    ASK your doctor about these medications         miconazole Kit   Commonly known as: MONISTAT-3   Place 1 each vaginally at bedtime.      naproxen sodium 220 MG tablet   Commonly known as: ANAPROX   Take 220 mg by mouth 2 (two) times daily as needed. For pain.         Rulon Abide Medical Resident 10/02/2011 3:56 PM

## 2011-10-02 NOTE — MAU Note (Signed)
Pt reports having vaginal irritation, burning and yellow discharge for the past 4 days. Suspect her partner was not faithful. Tried OTC yeast cream without relief. Stated it seem to make it worse.

## 2011-10-02 NOTE — MAU Provider Note (Signed)
I examined pt and agree with documentation above and resident plan of care. MUHAMMAD,WALIDAH  

## 2011-10-03 LAB — GC/CHLAMYDIA PROBE AMP, GENITAL: GC Probe Amp, Genital: NEGATIVE

## 2011-10-04 ENCOUNTER — Emergency Department (HOSPITAL_COMMUNITY): Admission: EM | Admit: 2011-10-04 | Discharge: 2011-10-04 | Discharge status: Against Medical Advice | Payer: Self-pay

## 2011-10-04 NOTE — ED Notes (Signed)
1st attempt to call patient. No response from patient. We checked the restroom as well

## 2011-10-04 NOTE — ED Notes (Signed)
2nd attempt made to call patient. No response

## 2011-10-18 ENCOUNTER — Emergency Department (HOSPITAL_COMMUNITY)
Admission: EM | Admit: 2011-10-18 | Discharge: 2011-10-18 | Disposition: A | Discharge status: Home / Self Care | Payer: Medicaid Other | Attending: Emergency Medicine | Admitting: Emergency Medicine

## 2011-10-18 ENCOUNTER — Telehealth: Payer: Self-pay | Admitting: Nurse Practitioner

## 2011-10-18 DIAGNOSIS — Z3490 Encounter for supervision of normal pregnancy, unspecified, unspecified trimester: Secondary | ICD-10-CM

## 2011-10-18 DIAGNOSIS — O99891 Other specified diseases and conditions complicating pregnancy: Secondary | ICD-10-CM | POA: Insufficient documentation

## 2011-10-18 DIAGNOSIS — K089 Disorder of teeth and supporting structures, unspecified: Secondary | ICD-10-CM | POA: Insufficient documentation

## 2011-10-18 DIAGNOSIS — T85848A Pain due to other internal prosthetic devices, implants and grafts, initial encounter: Secondary | ICD-10-CM

## 2011-10-18 DIAGNOSIS — O99330 Smoking (tobacco) complicating pregnancy, unspecified trimester: Secondary | ICD-10-CM | POA: Insufficient documentation

## 2011-10-18 MED ORDER — OXYCODONE-ACETAMINOPHEN 5-325 MG PO TABS: 1.0000 | ORAL_TABLET | Freq: Four times a day (QID) | ORAL | Status: DC | PRN

## 2011-10-18 MED ORDER — OXYCODONE-ACETAMINOPHEN 5-325 MG PO TABS
2.0000 | ORAL_TABLET | Freq: Once | ORAL | Status: AC
  Administered 2011-10-18: 2 via ORAL
  Filled 2011-10-18: qty 2

## 2011-10-18 NOTE — ED Notes (Signed)
Pt c/o dental pain. Pt has dentist appt next Wed. Pt states she has taken PCN for the infected tooth. Pt states she is 8 mnths pregnant. Pain is worse now and giving her headaches.

## 2011-10-18 NOTE — Telephone Encounter (Signed)
Client lost her paper prescription for Diflucan.  Wants it to be called to [facility] on [address].  Chart reviewed.  Prescription called in for Diflucan 150 mg single dose (#1) no refills.

## 2011-10-18 NOTE — ED Provider Notes (Signed)
History     CSN: [id number]  Arrival date & time 10/18/11  2952   First MD Initiated Contact with Patient 10/18/11 1946      Chief Complaint  Patient presents with  . Dental Pain    (Consider location/radiation/quality/duration/timing/severity/associated sxs/prior treatment) HPI Comments: [name] [name] 21 y.o. female   The chief complaint is: Patient presents with:   Dental Pain   The patient has medical history significant for:   Past Medical History:   No pertinent past medical history                           Patient presents with complaint of dental pain. Patient is currently 8 months pregnant and was seen at [facility] hospital on 09/08/11. Patient was given script for penicillin at states she has been adherent. Patient states she declined pain medication at the time due to pregnancy status. She states the pain is right sided , 8/10, and constant. Patient denies fever, chills, or associated symptoms.     The history is provided by the patient. No language interpreter was used.    Past Medical History  Diagnosis Date  . No pertinent past medical history     Past Surgical History  Procedure Date  . No past surgeries     No family history on file.  History  Substance Use Topics  . Smoking status: Current Some Day Smoker -- 0.5 packs/day    Types: Cigarettes  . Smokeless tobacco: Not on file  . Alcohol Use: No     occ    OB History    Grav Para Term Preterm Abortions TAB SAB Ect Mult Living   6 0 0 0 5 1 4 0 0 0       Review of Systems  Constitutional: Negative for fever and chills.  HENT: Positive for dental problem.     Allergies  Coconut fatty acids  Home Medications   Current Outpatient Rx  Name Route Sig Dispense Refill  . MICONAZOLE NITRATE VA KIT Vaginal Place 1 each vaginally at bedtime.    Marland Kitchen NAPROXEN SODIUM 220 MG PO TABS Oral Take 220 mg by mouth 2 (two) times daily as needed. For pain.    Marland Kitchen OVER THE COUNTER MEDICATION Oral Take 2  capsules by mouth daily. OTC prenatal gummy vitamin      BP 120/69  Pulse 80  Temp 98.2 F (36.8 C) (Oral)  Resp 16  SpO2 98%  LMP 03/23/2011  Physical Exam  Constitutional: She appears well-developed and well-nourished.  HENT:  Head: Normocephalic and atraumatic. No trismus in the jaw.  Mouth/Throat: Uvula is midline and oropharynx is clear and moist. Abnormal dentition. Dental caries present. No uvula swelling. No oropharyngeal exudate.         No swelling of the neck or cervical lymphadenopathy appreciated.  Eyes: Conjunctivae normal and EOM are normal.    ED Course  Procedures (including critical care time)  Labs Reviewed - No data to display No results found.   1. Dental implant pain   2. Pregnancy       MDM  Patient, 8 months pregnant, presented with complaint of dental pain. Patient adherent to penicillin given at visit in August at [facility] but had not followed-up with on call dentist due to finances. Patient given pain medication in ED with resolution of dental pain. Patient instructed to continue taking antibiotics, discharged on a small course of pain medication, and referral to dentist  on call. Return precautions given. No red flags for Ludwig's angina.        Pixie Casino, PA-C 10/19/11 0023

## 2011-10-19 ENCOUNTER — Encounter: Payer: Self-pay | Admitting: Obstetrics & Gynecology

## 2011-10-19 NOTE — ED Provider Notes (Signed)
Medical screening examination/treatment/procedure(s) were performed by non-physician practitioner and as supervising physician I was immediately available for consultation/collaboration. Devoria Albe, MD, FACEP   Ward Givens, MD 10/19/11 [phone]

## 2011-10-26 ENCOUNTER — Encounter (HOSPITAL_COMMUNITY): Payer: Self-pay | Admitting: Emergency Medicine

## 2011-10-26 ENCOUNTER — Emergency Department (HOSPITAL_COMMUNITY)
Admission: EM | Admit: 2011-10-26 | Discharge: 2011-10-26 | Disposition: A | Discharge status: Home / Self Care | Payer: Medicaid Other | Attending: Emergency Medicine | Admitting: Emergency Medicine

## 2011-10-26 DIAGNOSIS — Z91018 Allergy to other foods: Secondary | ICD-10-CM | POA: Insufficient documentation

## 2011-10-26 DIAGNOSIS — K0889 Other specified disorders of teeth and supporting structures: Secondary | ICD-10-CM

## 2011-10-26 DIAGNOSIS — K089 Disorder of teeth and supporting structures, unspecified: Secondary | ICD-10-CM | POA: Insufficient documentation

## 2011-10-26 DIAGNOSIS — F17200 Nicotine dependence, unspecified, uncomplicated: Secondary | ICD-10-CM | POA: Insufficient documentation

## 2011-10-26 MED ORDER — HYDROCODONE-ACETAMINOPHEN 5-325 MG PO TABS: 1.0000 | ORAL_TABLET | ORAL | Status: DC | PRN

## 2011-10-26 NOTE — ED Notes (Signed)
Pt reports persistent dental pain. Stated that she is scheduled for oral surgery on 10/31

## 2011-10-26 NOTE — ED Provider Notes (Signed)
History     CSN: [id number]  Arrival date & time 10/26/11  1237   First MD Initiated Contact with Patient 10/26/11 1406      Chief Complaint  Patient presents with  . Dental Pain    pain in upper right tooth    (Consider location/radiation/quality/duration/timing/severity/associated sxs/prior treatment) HPI  21 year old female who is 8 months pregnant presents complaining of dental pain. Pt reports for nearly a month she has been having pain to her R lower molar and premolar.  Pain is gradua onset, wax and wane, sharp, throbbing, worsening with cold water or with eating.  Have a hard time sleeping at night.  Was seen here in ED 9 days ago and was given abx and pain medication.  Did f/u with a dentist, Dr. Maurice March who scheduled pt to have dental extraction on Oct 31st.  However, pt sts her pain has been persistent and she has no pain medication.  Denies fever, sore throat, neck pain or rash.  Pt is a G4P0, having multiple miscarriage due to significant hx of STD.    Past Medical History  Diagnosis Date  . No pertinent past medical history     Past Surgical History  Procedure Date  . No past surgeries     History reviewed. No pertinent family history.  History  Substance Use Topics  . Smoking status: Current Some Day Smoker -- 0.5 packs/day    Types: Cigarettes  . Smokeless tobacco: Not on file  . Alcohol Use: No     occ    OB History    Grav Para Term Preterm Abortions TAB SAB Ect Mult Living   6 0 0 0 5 1 4 0 0 0       Review of Systems  Constitutional: Negative for fever.  HENT: Positive for dental problem. Negative for sore throat, neck pain and neck stiffness.   Skin: Negative for rash.  Neurological: Negative for numbness.    Allergies  Coconut fatty acids  Home Medications   Current Outpatient Rx  Name Route Sig Dispense Refill  . AMOXICILLIN 500 MG PO CAPS Oral Take 500 mg by mouth 2 (two) times daily. Pt to take for 10 days. Pt's on day 7 of therapy      . OVER THE COUNTER MEDICATION Oral Take 2 capsules by mouth daily. OTC prenatal gummy vitamin    . MICONAZOLE NITRATE VA KIT Vaginal Place 1 each vaginally at bedtime.    Marland Kitchen NAPROXEN SODIUM 220 MG PO TABS Oral Take 220 mg by mouth 2 (two) times daily as needed. For pain.    . OXYCODONE-ACETAMINOPHEN 5-325 MG PO TABS Oral Take 1 tablet by mouth every 6 (six) hours as needed for pain. 10 tablet 0    BP 111/66  Pulse 83  Temp 98.1 F (36.7 C) (Oral)  Resp 18  SpO2 98%  LMP 03/23/2011  Physical Exam  Nursing note and vitals reviewed. Constitutional: She appears well-developed and well-nourished. No distress.  HENT:  Head: Normocephalic and atraumatic.  Right Ear: External ear normal.  Left Ear: External ear normal.  Nose: Nose normal.  Mouth/Throat: Oropharynx is clear and moist. No oropharyngeal exudate.    Eyes: Conjunctivae normal are normal.  Neck: Normal range of motion. Neck supple.  Lymphadenopathy:    She has no cervical adenopathy.  Neurological: She is alert.  Skin: Skin is warm.  Psychiatric: She has a normal mood and affect.    ED Course  Procedures (including critical care time)  Labs Reviewed - No data to display No results found.   No diagnosis found.  1. Dental pain  MDM  Persistent dental pain.  Is scheduled to have dental extraction on OCt 31st but unable to sleep due to pain.  I spend moderate amount of time discussed risk/benefit of narcotic pain medication and pregnancy.  Pt accept all risk and promise to use pain medication sparingly.  I will prescribe a short course of pain med.  Pt to f/u with dentist.    BP 111/66  Pulse 83  Temp 98.1 F (36.7 C) (Oral)  Resp 18  SpO2 98%  LMP 03/23/2011  Nursing notes reviewed and considered in documentation  Previous records reviewed and considered  All labs/vitals reviewed and considered            Fayrene Helper, PA-C 10/26/11 1427

## 2011-10-27 NOTE — ED Provider Notes (Signed)
Medical screening examination/treatment/procedure(s) were performed by non-physician practitioner and as supervising physician I was immediately available for consultation/collaboration.   Jalah Warmuth, MD 10/27/11 0706 

## 2011-10-28 ENCOUNTER — Encounter (HOSPITAL_COMMUNITY): Payer: Self-pay | Admitting: *Deleted

## 2011-10-28 ENCOUNTER — Inpatient Hospital Stay (HOSPITAL_COMMUNITY)
Admission: AD | Admit: 2011-10-28 | Discharge: 2011-10-28 | Discharge status: Against Medical Advice | Payer: Medicaid Other | Source: Ambulatory Visit | Attending: Family Medicine | Admitting: Family Medicine

## 2011-10-28 DIAGNOSIS — R05 Cough: Secondary | ICD-10-CM | POA: Insufficient documentation

## 2011-10-28 DIAGNOSIS — R079 Chest pain, unspecified: Secondary | ICD-10-CM | POA: Insufficient documentation

## 2011-10-28 DIAGNOSIS — R059 Cough, unspecified: Secondary | ICD-10-CM | POA: Insufficient documentation

## 2011-10-28 DIAGNOSIS — R509 Fever, unspecified: Secondary | ICD-10-CM | POA: Insufficient documentation

## 2011-10-28 DIAGNOSIS — O99891 Other specified diseases and conditions complicating pregnancy: Secondary | ICD-10-CM | POA: Insufficient documentation

## 2011-10-28 LAB — URINALYSIS, ROUTINE W REFLEX MICROSCOPIC
Bilirubin Urine: NEGATIVE
Glucose, UA: NEGATIVE mg/dL
Hgb urine dipstick: NEGATIVE
Protein, ur: NEGATIVE mg/dL
Specific Gravity, Urine: 1.01 (ref 1.005–1.030)
Urobilinogen, UA: 0.2 mg/dL (ref 0.0–1.0)

## 2011-10-28 NOTE — MAU Note (Signed)
Pt states she wants to leave.  Informed pt F/P wants to further evaluate her sx's.  Pt upset, states "the doctor told me I don't have a cough."  AMA form filled out, pt refuses to sign.

## 2011-10-28 NOTE — MAU Provider Note (Signed)
History     CSN: [id number]  Arrival date and time: 10/28/11 1150   First Provider Initiated Contact with Patient 10/28/11 1301      Chief Complaint  Patient presents with  . Cough  . Fever  . Chest Pain  . Abdominal Pain   HPI  Symptoms began yesterday with chest pain and cough.  Felt nauseous, dehydrated, with headache.  Patient reports subjective fever at home.  Vomited four times yesterday, none today.    Chest pain- worsens with deep breathing and coughing.  Patient points to central sternum and reports a sharp pain.  Is having acid reflux and heartburn.  Cough has had a small amount of blood tinge and is otherwise non-productive.  This has resolved.  Denies vaginal bleeding, fluid leakage.  Has felt consistent baby movement, described irregular contractions, lasting 1 minute, hour apart.  Patient has not had any prenatal care, applied for medicaid in September.  Patient would like to receive [facility] at [facility].  OB History    Grav Para Term Preterm Abortions TAB SAB Ect Mult Living   6 0 0 0 5 1 4 0 0 0      All these SAB occurred in the first trimester.   Past Medical History  Diagnosis Date  . No pertinent past medical history     Past Surgical History  Procedure Date  . No past surgeries     History reviewed. No pertinent family history.  History  Substance Use Topics  . Smoking status: Former Smoker -- 0.5 packs/day    Types: Cigarettes  . Smokeless tobacco: Not on file  . Alcohol Use: No     occ    Allergies:  Allergies  Allergen Reactions  . Coconut Fatty Acids Anaphylaxis and Hives    Prescriptions prior to admission  Medication Sig Dispense Refill  . amoxicillin (AMOXIL) 500 MG capsule Take 500 mg by mouth 2 (two) times daily. Pt to take for 10 days. Pt's on day 7 of therapy      . HYDROcodone-acetaminophen (NORCO/VICODIN) 5-325 MG per tablet Take 1 tablet by mouth every 4 (four) hours as needed for pain.  6 tablet  0  . miconazole  (MONISTAT-3) KIT Place 1 each vaginally at bedtime.      Marland Kitchen OVER THE COUNTER MEDICATION Take 2 capsules by mouth daily. OTC prenatal gummy vitamin        ROS Gen: see above CV: see above Pulm: Denies SOB Abd: Denies Abdominal pain other than contractions.  Denies diarrhea GU: Denies difficulty urinating  Physical Exam   Blood pressure 107/64, pulse 90, temperature 98.3 F (36.8 C), temperature source Oral, height 5' 3.5" (1.613 m), weight 75.932 kg (167 lb 6.4 oz), last menstrual period 03/23/2011, SpO2 99.00%.  Physical Exam Gen: AAOx3, NAD CV: RRR, no MRG, pulses intact b/l Pulm: CTA, no crackles appreciated Abd: Soft, Fundus appropriate for GA, +BS, -CVA tenderness  MAU Course  Procedures FHT- reassuring, accels present, no decels.  Irregular contractions.  Assessment and Plan  21yoF G6P0050@[redacted]w[redacted]d  p/w cough, pleuritic chest pain, subjective fever at home  - Symptoms resolved. - Patient not coughing, afebrile, and pleuritic chest pain has resolved - Patient without PNC, attempted to set up patient with [facility] but patient refused and left AMA - Patient was very upset that ultrasound was not indicated for this visit.  Sonia Side 10/28/2011, 1:01 PM   CNM Addendum:  Patient had a normal physical exam with no overt respiratory issues observed Informed  that [place] was not indicated right now (though I was planning to schedule her an outpatient [place]) Pt left abruptly, AMA.  States "I guess I am fine,  I want to go" I told her we wanted to complete her care and make sure everything was taken care of, she declined  Wynelle Bourgeois CNM

## 2011-10-28 NOTE — MAU Note (Signed)
Patient states she has been having cold like symptoms for several days, has a cough and congestion. States abdominal pain with coughing. Had a fever of 101 yesterday. Has shortness of breath. Patient has had no prenatal care.

## 2011-10-31 NOTE — MAU Provider Note (Signed)
Chart reviewed and agree with management and plan.  

## 2011-11-01 ENCOUNTER — Emergency Department (HOSPITAL_COMMUNITY)
Admission: EM | Admit: 2011-11-01 | Discharge: 2011-11-01 | Disposition: A | Discharge status: Home / Self Care | Payer: Medicaid Other | Attending: Emergency Medicine | Admitting: Emergency Medicine

## 2011-11-01 ENCOUNTER — Encounter (HOSPITAL_COMMUNITY): Payer: Self-pay | Admitting: Emergency Medicine

## 2011-11-01 DIAGNOSIS — K0889 Other specified disorders of teeth and supporting structures: Secondary | ICD-10-CM

## 2011-11-01 DIAGNOSIS — O99891 Other specified diseases and conditions complicating pregnancy: Secondary | ICD-10-CM | POA: Insufficient documentation

## 2011-11-01 DIAGNOSIS — K089 Disorder of teeth and supporting structures, unspecified: Secondary | ICD-10-CM | POA: Insufficient documentation

## 2011-11-01 DIAGNOSIS — K029 Dental caries, unspecified: Secondary | ICD-10-CM | POA: Insufficient documentation

## 2011-11-01 MED ORDER — OXYCODONE-ACETAMINOPHEN 5-325 MG PO TABS
1.0000 | ORAL_TABLET | Freq: Once | ORAL | Status: AC
  Administered 2011-11-01: 1 via ORAL

## 2011-11-01 MED ORDER — OXYCODONE-ACETAMINOPHEN 5-325 MG PO TABS
ORAL_TABLET | ORAL | Status: AC
  Administered 2011-11-01: 1 via ORAL
  Filled 2011-11-01: qty 1

## 2011-11-01 MED ORDER — BUPIVACAINE-EPINEPHRINE PF 0.5-1:200000 % IJ SOLN
1.8000 mL | Freq: Once | INTRAMUSCULAR | Status: AC
  Administered 2011-11-01: 9 mg
  Filled 2011-11-01: qty 1.8

## 2011-11-01 MED ORDER — HYDROCODONE-ACETAMINOPHEN 5-325 MG PO TABS: ORAL_TABLET | ORAL | Status: DC

## 2011-11-01 MED ORDER — LIDOCAINE HCL 2 % IJ SOLN
20.0000 mL | Freq: Once | INTRAMUSCULAR | Status: AC
  Administered 2011-11-01: 400 mg via INTRADERMAL
  Filled 2011-11-01: qty 20

## 2011-11-01 MED ORDER — OXYCODONE-ACETAMINOPHEN 5-325 MG PO TABS: 1.0000 | ORAL_TABLET | Freq: Four times a day (QID) | ORAL | Status: DC | PRN

## 2011-11-01 NOTE — ED Provider Notes (Signed)
History     CSN: [id number]  Arrival date & time 11/01/11  0809   First MD Initiated Contact with Patient 11/01/11 [phone]      Chief Complaint  Patient presents with  . Dental Pain    (Consider location/radiation/quality/duration/timing/severity/associated sxs/prior treatment) HPI Comments: Patient with multiple recent visits for dental pain, 8 months pregnant -- presents with complaint of right mandibular molar pain. Patient has recently finished a course of amoxicillin and pain medication and the pain has returned. She states she will have these teeth extracted later this month. She is also taking ibuprofen and orajel without relief. No neck pain or swelling. Pain radiates to R face. Onset gradual. Course constant. Nothing makes symptoms better.  Patient is a 21 y.o. female presenting with tooth pain. The history is provided by the patient.  Dental PainPrimary symptoms do not include headaches, fever, shortness of breath or sore throat.  Additional symptoms do not include: facial swelling, trouble swallowing and ear pain.    Past Medical History  Diagnosis Date  . No pertinent past medical history     Past Surgical History  Procedure Date  . No past surgeries     History reviewed. No pertinent family history.  History  Substance Use Topics  . Smoking status: Former Smoker -- 0.5 packs/day    Types: Cigarettes  . Smokeless tobacco: Not on file  . Alcohol Use: No     occ    OB History    Grav Para Term Preterm Abortions TAB SAB Ect Mult Living   6 0 0 0 5 1 4 0 0 0       Review of Systems  Constitutional: Negative for fever.  HENT: Positive for dental problem. Negative for ear pain, sore throat, facial swelling, trouble swallowing and neck pain.   Respiratory: Negative for shortness of breath and stridor.   Skin: Negative for color change.  Neurological: Negative for headaches.    Allergies  Coconut fatty acids  Home Medications   Current Outpatient Rx    Name Route Sig Dispense Refill  . HYDROCODONE-ACETAMINOPHEN 5-325 MG PO TABS Oral Take 1 tablet by mouth every 4 (four) hours as needed for pain. 6 tablet 0  . OVER THE COUNTER MEDICATION Oral Take 2 capsules by mouth daily. OTC prenatal gummy vitamin    . AMOXICILLIN 500 MG PO CAPS Oral Take 500 mg by mouth 2 (two) times daily. Pt to take for 10 days. Pt's on day 7 of therapy    . MICONAZOLE NITRATE VA KIT Vaginal Place 1 each vaginally at bedtime.      BP 97/51  Pulse 68  Temp 97.8 F (36.6 C) (Oral)  Resp 18  SpO2 99%  LMP 03/23/2011  Physical Exam  Nursing note and vitals reviewed. Constitutional: She appears well-developed and well-nourished.  HENT:  Head: Normocephalic and atraumatic. No trismus in the jaw.  Right Ear: Tympanic membrane, external ear and ear canal normal.  Left Ear: Tympanic membrane, external ear and ear canal normal.  Nose: Nose normal.  Mouth/Throat: Uvula is midline, oropharynx is clear and moist and mucous membranes are normal. Abnormal dentition. Dental caries present. No dental abscesses or uvula swelling. No tonsillar abscesses.         Significant periodontal disease. Tenderness in area denoted. No swelling or erythema noted on exam.  Eyes: Conjunctivae normal are normal.  Neck: Normal range of motion. Neck supple.       No neck swelling or Ludwig's angina  Lymphadenopathy:  She has no cervical adenopathy.  Neurological: She is alert.  Skin: Skin is warm and dry.  Psychiatric: She has a normal mood and affect.    ED Course  Procedures (including critical care time)  Labs Reviewed - No data to display No results found.   1. Pain, dental     8:43 AM Patient seen and examined. Will attempt dental block. Patient told not to take ibuprofen or other NSAIDs during 3rd trimester of pregnancy. Patient verbalizes understanding.   Vital signs reviewed and are as follows: Filed Vitals:   11/01/11 0827  BP: 97/51  Pulse: 68  Temp: 97.8 F  (36.6 C)  Resp: 18   9:35 AM Dental block was performed. 1.48mL of 2% lidocaine without epi was combined with 1.81mL 0.5% bupivacaine with epi and an alveolar block was performed. Injections made at base of tooth as well as the tooth immediately posterior and anterior to tooth. Partial anesthesia was obtained. Minimal bleeding after injections. Patient tolerated procedure well with no immediate complications.   Patient counseled on use of narcotic pain medications. Patient counseled to use sparingly given pregnancy. Counseled not to combine these medications with others containing tylenol. Urged not to drink alcohol, drive, or perform any other activities that requires focus while taking these medications. The patient verbalizes understanding and agrees with the plan.   MDM  Patient with toothache.  No gross abscess.  Exam unconcerning for Ludwig's angina or other deep tissue infection in neck.  Will treat with pain medicine.  Urged patient to follow-up with dentist.           Renne Crigler, PA 11/02/11 1921

## 2011-11-01 NOTE — ED Notes (Signed)
Pt states that she has an appt to get two teeth pulled on the 31st.  Two back molars on rt side on bottom.  One has a large hole in it, and the other is split in half.  Pt is 8 months pregnant.

## 2011-11-03 NOTE — ED Provider Notes (Signed)
Medical screening examination/treatment/procedure(s) were performed by non-physician practitioner and as supervising physician I was immediately available for consultation/collaboration.  Devlin Mcveigh R. Josefina Rynders, MD 11/03/11 2357 

## 2011-11-25 ENCOUNTER — Encounter (HOSPITAL_COMMUNITY): Payer: Self-pay | Admitting: *Deleted

## 2011-11-25 ENCOUNTER — Inpatient Hospital Stay (HOSPITAL_COMMUNITY)
Admission: AD | Admit: 2011-11-25 | Discharge: 2011-11-25 | Disposition: A | Discharge status: Home / Self Care | Payer: Medicaid Other | Source: Ambulatory Visit | Attending: Obstetrics & Gynecology | Admitting: Obstetrics & Gynecology

## 2011-11-25 DIAGNOSIS — O479 False labor, unspecified: Secondary | ICD-10-CM | POA: Insufficient documentation

## 2011-11-25 DIAGNOSIS — O26899 Other specified pregnancy related conditions, unspecified trimester: Secondary | ICD-10-CM

## 2011-11-25 DIAGNOSIS — R109 Unspecified abdominal pain: Secondary | ICD-10-CM

## 2011-11-25 DIAGNOSIS — O0930 Supervision of pregnancy with insufficient antenatal care, unspecified trimester: Secondary | ICD-10-CM | POA: Insufficient documentation

## 2011-11-25 LAB — CBC WITH DIFFERENTIAL/PLATELET
Basophils Absolute: 0 10*3/uL (ref 0.0–0.1)
Eosinophils Relative: 0 % (ref 0–5)
HCT: 27.3 % — ABNORMAL LOW (ref 36.0–46.0)
Hemoglobin: 8.9 g/dL — ABNORMAL LOW (ref 12.0–15.0)
Lymphocytes Relative: 26 % (ref 12–46)
MCHC: 32.6 g/dL (ref 30.0–36.0)
MCV: 80.3 fL (ref 78.0–100.0)
Monocytes Absolute: 1 10*3/uL (ref 0.1–1.0)
Monocytes Relative: 11 % (ref 3–12)
RDW: 13.3 % (ref 11.5–15.5)
WBC: 9.5 10*3/uL (ref 4.0–10.5)

## 2011-11-25 LAB — URINALYSIS, ROUTINE W REFLEX MICROSCOPIC
Leukocytes, UA: NEGATIVE
Protein, ur: NEGATIVE mg/dL
Urobilinogen, UA: 0.2 mg/dL (ref 0.0–1.0)

## 2011-11-25 LAB — RAPID URINE DRUG SCREEN, HOSP PERFORMED
Amphetamines: NOT DETECTED
Barbiturates: NOT DETECTED
Benzodiazepines: NOT DETECTED
Cocaine: NOT DETECTED
Tetrahydrocannabinol: NOT DETECTED

## 2011-11-25 LAB — OB RESULTS CONSOLE HIV ANTIBODY (ROUTINE TESTING): HIV: NONREACTIVE

## 2011-11-25 LAB — GROUP B STREP BY PCR: Group B strep by PCR: POSITIVE — AB

## 2011-11-25 MED ORDER — HYDROXYZINE HCL 25 MG PO TABS
25.0000 mg | ORAL_TABLET | Freq: Once | ORAL | Status: AC
  Administered 2011-11-25: 25 mg via ORAL
  Filled 2011-11-25: qty 1

## 2011-11-25 MED ORDER — HYDROXYZINE PAMOATE 25 MG PO CAPS: 25.0000 mg | ORAL_CAPSULE | Freq: Every day | ORAL | Status: DC

## 2011-11-25 NOTE — MAU Note (Signed)
Patient has had no prenatal care except for MAU visits. States she has been having abdominal pain/contractions for three days with a vagina discharge. Reports no bleeding and good fetal movement.

## 2011-11-25 NOTE — MAU Provider Note (Signed)
History     CSN: [id number]  Arrival date and time: 11/25/11 1006  [name] [name] 21 y.o. [id number] [redacted]w[redacted]d   Chief Complaint  Patient presents with  . Labor Eval   HPI Patient presented to the MAU this morning for a history of contractions for three days. She was told to come in by her mother because "that is not good for the baby." She describes the contractions as a "constant pain that comes and goes every couple of minutes."  She denies leaking, gush of fluid or vaginal bleeding. She has felt her baby move today. She is a first time mother, and wants this labor to happen. She states she is miserable. Patient does not have steady PNC and comes to the MAU mostly for her care.   Past Medical History  Diagnosis Date  . No pertinent past medical history     Past Surgical History  Procedure Date  . No past surgeries     No family history on file.  History  Substance Use Topics  . Smoking status: Former Smoker -- 0.5 packs/day    Types: Cigarettes  . Smokeless tobacco: Not on file  . Alcohol Use: No     Comment: occ    Allergies:  Allergies  Allergen Reactions  . Coconut Fatty Acids Anaphylaxis and Hives    Prescriptions prior to admission  Medication Sig Dispense Refill  . amoxicillin (AMOXIL) 500 MG capsule Take 500 mg by mouth 2 (two) times daily. Pt to take for 10 days. Pt's on day 7 of therapy      . OVER THE COUNTER MEDICATION Take 2 capsules by mouth daily. OTC prenatal gummy vitamin      . oxyCODONE-acetaminophen (PERCOCET/ROXICET) 5-325 MG per tablet Take 1-2 tablets by mouth every 6 (six) hours as needed for pain.  8 tablet  0    Review of Systems  Constitutional: Negative for fever and chills.  Eyes: Negative for blurred vision and double vision.  Respiratory: Negative for shortness of breath.   Cardiovascular: Negative for chest pain and palpitations.  Gastrointestinal: Positive for abdominal pain. Negative for heartburn, nausea, vomiting and diarrhea.    Genitourinary: Negative for dysuria, urgency and frequency.  Musculoskeletal: Positive for back pain.  Neurological: Positive for headaches. Negative for dizziness.  All other systems reviewed and are negative.   Physical Exam   Blood pressure 112/69, pulse 83, temperature 97.6 F (36.4 C), temperature source Oral, resp. rate 16, height 5\' 5"  (1.651 m), weight 76.476 kg (168 lb 9.6 oz), last menstrual period 03/23/2011, SpO2 98.00%.  Physical Exam  Nursing note and vitals reviewed. Constitutional: She is oriented to person, place, and time. She appears well-developed and well-nourished. No distress.  Eyes: Right eye exhibits no discharge. Left eye exhibits no discharge. No scleral icterus.  Cardiovascular: Normal rate.   Respiratory: Effort normal.  GI: Soft. She exhibits no distension and no mass. There is no tenderness. There is no rebound and no guarding.       Gravid   Genitourinary: Vagina normal and uterus normal. No vaginal discharge found.  Musculoskeletal: She exhibits no edema and no tenderness.  Neurological: She is alert and oriented to person, place, and time.  Skin: Skin is warm and dry.  Psychiatric: She has a normal mood and affect.  Dilation: Closed Effacement (%): Thick Cervical Position: Posterior Station: -3 Exam by:: Dr. Angus Seller  EFM: FHR 130's, moderate variability, accelerations present, decelerations absent Results for orders placed during the hospital encounter of  11/25/11 (from the past 24 hour(s))  URINALYSIS, ROUTINE W REFLEX MICROSCOPIC     Status: Normal   Collection Time   11/25/11 11:15 AM      Component Value Range   Color, Urine YELLOW  YELLOW   APPearance CLEAR  CLEAR   Specific Gravity, Urine 1.010  1.005 - 1.030   pH 6.0  5.0 - 8.0   Glucose, UA NEGATIVE  NEGATIVE mg/dL   Hgb urine dipstick NEGATIVE  NEGATIVE   Bilirubin Urine NEGATIVE  NEGATIVE   Ketones, ur NEGATIVE  NEGATIVE mg/dL   Protein, ur NEGATIVE  NEGATIVE mg/dL    Urobilinogen, UA 0.2  0.0 - 1.0 mg/dL   Nitrite NEGATIVE  NEGATIVE   Leukocytes, UA NEGATIVE  NEGATIVE  URINE RAPID DRUG SCREEN (HOSP PERFORMED)     Status: Normal   Collection Time   11/25/11 11:15 AM      Component Value Range   Opiates NONE DETECTED  NONE DETECTED   Cocaine NONE DETECTED  NONE DETECTED   Benzodiazepines NONE DETECTED  NONE DETECTED   Amphetamines NONE DETECTED  NONE DETECTED   Tetrahydrocannabinol NONE DETECTED  NONE DETECTED   Barbiturates NONE DETECTED  NONE DETECTED  ABO/RH     Status: Normal   Collection Time   11/25/11 11:45 AM      Component Value Range   ABO/RH(D) A POS    CBC WITH DIFFERENTIAL     Status: Abnormal   Collection Time   11/25/11 11:45 AM      Component Value Range   WBC 9.5  4.0 - 10.5 K/uL   RBC 3.40 (*) 3.87 - 5.11 MIL/uL   Hemoglobin 8.9 (*) 12.0 - 15.0 g/dL   HCT 01.0 (*) 27.2 - 53.6 %   MCV 80.3  78.0 - 100.0 fL   MCH 26.2  26.0 - 34.0 pg   MCHC 32.6  30.0 - 36.0 g/dL   RDW 64.4  03.4 - 74.2 %   Platelets 221  150 - 400 K/uL   Neutrophils Relative 63  43 - 77 %   Neutro Abs 6.0  1.7 - 7.7 K/uL   Lymphocytes Relative 26  12 - 46 %   Lymphs Abs 2.5  0.7 - 4.0 K/uL   Monocytes Relative 11  3 - 12 %   Monocytes Absolute 1.0  0.1 - 1.0 K/uL   Eosinophils Relative 0  0 - 5 %   Eosinophils Absolute 0.0  0.0 - 0.7 K/uL   Basophils Relative 0  0 - 1 %   Basophils Absolute 0.0  0.0 - 0.1 K/uL  RAPID HIV SCREEN ([facility])     Status: Normal   Collection Time   11/25/11 11:45 AM      Component Value Range   SUDS Rapid HIV Screen NON REACTIVE  NON REACTIVE    MAU Course  Procedures 1. EFM 2. UA/UDS 3 Cervical exam 4. GBS PCR, Prenatal labs  Assessment and Plan  1. Uterine irritability  - Vistaril 25 mg  - Encourage PO fluids, 10 glasses a day (80 ounces)  - Encourage Iron supplement 2. Discharge Home with labor precautions  - Discussed plan with L. Leftwich-Kirby, CNM Browning, Renee 11/25/2011, 11:15 AM   I have seen this  patient and agree with the above resident's note with the following addition.  Message sent to [facility] clinic to make appointment for pt to begin prenatal care.  LEFTWICH-KIRBY, Jordie Skalsky Certified Nurse-Midwife

## 2011-12-12 ENCOUNTER — Encounter: Payer: Self-pay | Admitting: Obstetrics & Gynecology

## 2011-12-12 ENCOUNTER — Ambulatory Visit (INDEPENDENT_AMBULATORY_CARE_PROVIDER_SITE_OTHER): Payer: Self-pay | Admitting: Obstetrics & Gynecology

## 2011-12-12 VITALS — BP 128/77 | Temp 97.6°F | Ht 64.0 in | Wt 162.8 lb

## 2011-12-12 DIAGNOSIS — Z3490 Encounter for supervision of normal pregnancy, unspecified, unspecified trimester: Secondary | ICD-10-CM | POA: Insufficient documentation

## 2011-12-12 DIAGNOSIS — O0930 Supervision of pregnancy with insufficient antenatal care, unspecified trimester: Secondary | ICD-10-CM

## 2011-12-12 LAB — POCT URINALYSIS DIP (DEVICE)
Ketones, ur: NEGATIVE mg/dL
Nitrite: NEGATIVE
Protein, ur: NEGATIVE mg/dL
Urobilinogen, UA: 0.2 mg/dL (ref 0.0–1.0)
pH: 7 (ref 5.0–8.0)

## 2011-12-12 MED ORDER — FLUCONAZOLE 150 MG PO TABS: 150.0000 mg | ORAL_TABLET | Freq: Once | ORAL | Status: DC

## 2011-12-12 MED ORDER — PANTOPRAZOLE SODIUM 40 MG PO TBEC: 40.0000 mg | DELAYED_RELEASE_TABLET | Freq: Every day | ORAL | Status: DC

## 2011-12-12 NOTE — Progress Notes (Signed)
Nutrition note: 1st visit consult Pt has h/o wt loss during this pregnancy. Pt has lost 11.2# @ [redacted]w[redacted]d. Pt reports eating 2 meals & 1-2 snacks/d.  Pt reports having N&V and heartburn. Pt reports taking PNV. Pt received general nutrition during pregnancy education & handout on tips to gain more wt. Disc wt gain goals of 15-25# or 0.6#/wk.  Pt agrees to continue taking PNV & include protein with all meals & snacks. Pt does not receive WIC but plans to apply. Pt plans to pump only. F/u if referred Blondell Reveal, MS, RD, LDN

## 2011-12-12 NOTE — Patient Instructions (Signed)
Normal Labor and Delivery  Your caregiver must first be sure you are in labor. Signs of labor include:  · You may pass what is called "the mucus plug" before labor begins. This is a small amount of blood stained mucus.  · Regular uterine contractions.  · The time between contractions get closer together.  · The discomfort and pain gradually gets more intense.  · Pains are mostly located in the back.  · Pains get worse when walking.  · The cervix (the opening of the uterus becomes thinner (begins to efface) and opens up (dilates).  Once you are in labor and admitted into the hospital or care center, your caregiver will do the following:  · A complete physical examination.  · Check your vital signs (blood pressure, pulse, temperature and the fetal heart rate).  · Do a vaginal examination (using a sterile glove and lubricant) to determine:  · The position (presentation) of the baby (head [vertex] or buttock first).  · The level (station) of the baby's head in the birth canal.  · The effacement and dilatation of the cervix.  · You may have your pubic hair shaved and be given an enema depending on your caregiver and the circumstance.  · An electronic monitor is usually placed on your abdomen. The monitor follows the length and intensity of the contractions, as well as the baby's heart rate.  · Usually, your caregiver will insert an IV in your arm with a bottle of sugar water. This is done as a precaution so that medications can be given to you quickly during labor or delivery.  NORMAL LABOR AND DELIVERY IS DIVIDED UP INTO 3 STAGES:  First Stage  This is when regular contractions begin and the cervix begins to efface and dilate. This stage can last from 3 to 15 hours. The end of the first stage is when the cervix is 100% effaced and 10 centimeters dilated. Pain medications may be given by   · Injection (morphine, demerol, etc.)  · Regional anesthesia (spinal, caudal or epidural, anesthetics given in different locations of  the spine). Paracervical pain medication may be given, which is an injection of and anesthetic on each side of the cervix.  A pregnant woman may request to have "Natural Childbirth" which is not to have any medications or anesthesia during her labor and delivery.  Second Stage  This is when the baby comes down through the birth canal (vagina) and is born. This can take 1 to 4 hours. As the baby's head comes down through the birth canal, you may feel like you are going to have a bowel movement. You will get the urge to bear down and push until the baby is delivered. As the baby's head is being delivered, the caregiver will decide if an episiotomy (a cut in the perineum and vagina area) is needed to prevent tearing of the tissue in this area. The episiotomy is sewn up after the delivery of the baby and placenta. Sometimes a mask with nitrous oxide is given for the mother to breath during the delivery of the baby to help if there is too much pain. The end of Stage 2 is when the baby is fully delivered. Then when the umbilical cord stops pulsating it is clamped and cut.  Third Stage  The third stage begins after the baby is completely delivered and ends after the placenta (afterbirth) is delivered. This usually takes 5 to 30 minutes. After the placenta is delivered, a medication   is given either by intravenous or injection to help contract the uterus and prevent bleeding. The third stage is not painful and pain medication is usually not necessary. If an episiotomy was done, it is repaired at this time.  After the delivery, the mother is watched and monitored closely for 1 to 2 hours to make sure there is no postpartum bleeding (hemorrhage). If there is a lot of bleeding, medication is given to contract the uterus and stop the bleeding.  Document Released: 10/13/2007 Document Revised: 03/28/2011 Document Reviewed: 10/13/2007  ExitCare® Patient Information ©2013 [employer].

## 2011-12-12 NOTE — Progress Notes (Signed)
No prenatal care. Sure LMP 38 weeks gest, [place] at 25 weeks confirms  Subjective:    [name] [name] is a G6P0050 [redacted]w[redacted]d being seen today for her first obstetrical visit.  Her obstetrical history is significant for late prenatal care, smoker. Patient does not intend to breast feed. Pregnancy history fully reviewed.  Patient reports heartburn.  Filed Vitals:   12/12/11 0945  BP: 128/77  Temp: 97.6 F (36.4 C)  Weight: 162 lb 12.8 oz (73.846 kg)    HISTORY: OB History    Grav Para Term Preterm Abortions TAB SAB Ect Mult Living   6 0 0 0 5 1 4 0 0 0      # Outc Date GA Lbr Len/2nd Wgt Sex Del Anes PTL Lv   1 TAB 9/04           2 SAB            3 SAB            4 SAB            5 SAB            6 CUR              Past Medical History  Diagnosis Date  . No pertinent past medical history    Past Surgical History  Procedure Date  . No past surgeries    History reviewed. No pertinent family history.   Exam    Uterus:  Fundal Height: 34 cm  Pelvic Exam:    Perineum: No Hemorrhoids, Normal Perineum   Vulva: normal   Vagina:  normal mucosa   pH:    Cervix: no lesions   Adnexa: not evaluated   Bony Pelvis: average  System: Breast:  deferred   Skin: normal coloration and turgor, no rashes    Neurologic: oriented, normal, grossly non-focal   Extremities: normal strength, tone, and muscle mass   HEENT neck supple with midline trachea   Mouth/Teeth dental hygiene good   Neck supple   Cardiovascular: regular rate and rhythm   Respiratory:  appears well, vitals normal, no respiratory distress, acyanotic, normal RR, neck free of mass or lymphadenopathy, chest clear, no wheezing, crepitations, rhonchi, normal symmetric air entry   Abdomen: gravid non-tender   Urinary: urethral meatus normal      Assessment:    Pregnancy: [id number] Patient Active Problem List  Diagnosis  . Supervision of normal pregnancy        Plan:     Initial labs drawn.and recorded Prenatal  vitamins. Problem list reviewed and updated. Genetic Screening too late. Normal [place] 25 weeks  Ultrasound discussed; fetal survey: results reviewed.  Follow up in 1 weeks. 50% of 30 min visit spent on counseling and coordination of care.  [facility] Wed   Aitan Rossbach 12/12/2011 Sx of yeast,Rx diflucan . Treat heartburn with protonix 40 mg

## 2011-12-12 NOTE — Progress Notes (Signed)
Pulse- 88 Patient reports having a lot of contractions and also having a thick white itching discharge for the past 2 days.  Needs gc/ch (had OB labs drawn in MAU on 11/8), 1 hr gtt @ 1059

## 2011-12-13 LAB — GLUCOSE TOLERANCE, 1 HOUR (50G) W/O FASTING: Glucose, 1 Hour GTT: 110 mg/dL (ref 70–140)

## 2011-12-17 ENCOUNTER — Encounter (HOSPITAL_COMMUNITY): Payer: Self-pay | Admitting: *Deleted

## 2011-12-17 ENCOUNTER — Inpatient Hospital Stay (HOSPITAL_COMMUNITY)
Admission: AD | Admit: 2011-12-17 | Discharge: 2011-12-17 | Disposition: A | Discharge status: Home / Self Care | Payer: Medicaid Other | Source: Ambulatory Visit | Attending: Obstetrics & Gynecology | Admitting: Obstetrics & Gynecology

## 2011-12-17 DIAGNOSIS — O479 False labor, unspecified: Secondary | ICD-10-CM

## 2011-12-17 MED ORDER — MORPHINE SULFATE 4 MG/ML IJ SOLN
2.0000 mg | Freq: Once | INTRAMUSCULAR | Status: AC
  Administered 2011-12-17: 2 mg via INTRAVENOUS
  Filled 2011-12-17: qty 1

## 2011-12-17 NOTE — MAU Note (Signed)
Pt reports having ctx since 2 am about 2-4 min apart. Denies leaking or bleeding at this time. Good fetal movment

## 2011-12-17 NOTE — MAU Provider Note (Signed)
  History     CSN: [id number]  Arrival date and time: 12/17/11 4098   None     No chief complaint on file.  HPI [name] [name] is a 21 y.o. G43P0050 female at [redacted]w[redacted]d who presents w/ report of uc's since 0200 that she felt were getting more frequent and stronger.  Unable to sleep.  Denies LOF or VB.  Good fm.  Denies urinary frequency, hesitancy, urgency, dysuria. Next visit on wed @ [facility]. OB History    Grav Para Term Preterm Abortions TAB SAB Ect Mult Living   6 0 0 0 5 1 4 0 0 0       Past Medical History  Diagnosis Date  . No pertinent past medical history     Past Surgical History  Procedure Date  . No past surgeries     History reviewed. No pertinent family history.  History  Substance Use Topics  . Smoking status: Current Some Day Smoker -- 0.1 packs/day for 8 years    Types: Cigarettes  . Smokeless tobacco: Never Used  . Alcohol Use: No     Comment: occ    Allergies:  Allergies  Allergen Reactions  . Coconut Fatty Acids Anaphylaxis and Hives    Prescriptions prior to admission  Medication Sig Dispense Refill  . HYDROcodone-acetaminophen (LORTAB 5) 5-500 MG per tablet Take 0.5 tablets by mouth every 6 (six) hours as needed.      Marland Kitchen OVER THE COUNTER MEDICATION Take 2 capsules by mouth daily. OTC prenatal gummy vitamin      . pantoprazole (PROTONIX) 40 MG tablet Take 1 tablet (40 mg total) by mouth daily.  30 tablet  1    Review of Systems  Constitutional: Negative.   HENT: Negative.   Eyes: Negative.   Respiratory: Negative.   Cardiovascular: Negative.   Gastrointestinal: Positive for abdominal pain (d/t uc's).  Genitourinary: Negative.   Musculoskeletal: Negative.   Skin: Negative.   Neurological: Negative.   Endo/Heme/Allergies: Negative.   Psychiatric/Behavioral: Negative.    Physical Exam   Blood pressure 120/73, pulse 65, temperature 97.9 F (36.6 C), temperature source Oral, resp. rate 18, height 5\' 4"  (1.626 m), last menstrual period  03/23/2011.  Physical Exam  Constitutional: She is oriented to person, place, and time. She appears well-developed and well-nourished.  HENT:  Head: Normocephalic.  Neck: Normal range of motion.  Cardiovascular: Normal rate and regular rhythm.   Respiratory: Effort normal and breath sounds normal.  GI: Soft.  Genitourinary: Vagina normal and uterus normal.       Sve: 1.5/50/-2, vtx- no change from RNs exam  Musculoskeletal: Normal range of motion.  Neurological: She is alert and oriented to person, place, and time. She has normal reflexes.  Skin: Skin is warm and dry.  Psychiatric: She has a normal mood and affect. Her behavior is normal. Judgment and thought content normal.   FHR: 125, mod variability, 15x15 accels, no decels= Cat I UCs: q 5-8, mild MAU Course  Procedures  NST SVE x2 Morphine 2mg  im x 1  Assessment and Plan  A:  [redacted]w[redacted]d SIUP  False/early labor  Cat I FHR   P:  D/C home  Rest, stay well hydrated  Return if labor, srom, vb, decreased fm, or other concerns  Keep appt @ [facility] on Wed 12/4 as scheduled  Marge Duncans 12/17/2011, 9:55 AM

## 2011-12-19 NOTE — MAU Provider Note (Signed)
Attestation of Attending Supervision of Advanced Practitioner (CNM/NP): Evaluation and management procedures were performed by the Advanced Practitioner under my supervision and collaboration.  I have reviewed the Advanced Practitioner's note and chart, and I agree with the management and plan.  Corah Willeford 12/19/2011 12:40 PM   

## 2011-12-20 ENCOUNTER — Inpatient Hospital Stay (HOSPITAL_COMMUNITY)
Admission: AD | Admit: 2011-12-20 | Discharge: 2011-12-20 | Disposition: A | Discharge status: Home / Self Care | Payer: Medicaid Other | Source: Ambulatory Visit | Attending: Obstetrics & Gynecology | Admitting: Obstetrics & Gynecology

## 2011-12-20 DIAGNOSIS — O26899 Other specified pregnancy related conditions, unspecified trimester: Secondary | ICD-10-CM

## 2011-12-20 DIAGNOSIS — M549 Dorsalgia, unspecified: Secondary | ICD-10-CM | POA: Insufficient documentation

## 2011-12-20 DIAGNOSIS — N949 Unspecified condition associated with female genital organs and menstrual cycle: Secondary | ICD-10-CM | POA: Insufficient documentation

## 2011-12-20 DIAGNOSIS — R102 Pelvic and perineal pain: Secondary | ICD-10-CM

## 2011-12-20 DIAGNOSIS — O479 False labor, unspecified: Secondary | ICD-10-CM | POA: Insufficient documentation

## 2011-12-20 MED ORDER — MORPHINE SULFATE 10 MG/ML IJ SOLN
10.0000 mg | Freq: Once | INTRAMUSCULAR | Status: AC
  Administered 2011-12-20: 10 mg via INTRAMUSCULAR
  Filled 2011-12-20: qty 1

## 2011-12-20 MED ORDER — PROMETHAZINE HCL 25 MG/ML IJ SOLN
12.5000 mg | Freq: Once | INTRAMUSCULAR | Status: AC
  Administered 2011-12-20: 12.5 mg via INTRAMUSCULAR
  Filled 2011-12-20: qty 1

## 2011-12-20 MED ORDER — FLUCONAZOLE 150 MG PO TABS: 150.0000 mg | ORAL_TABLET | Freq: Once | ORAL | Status: DC

## 2011-12-20 NOTE — MAU Provider Note (Signed)
History     CSN: [id number]  Arrival date and time: 12/20/11 1945   First Provider Initiated Contact with Patient 12/20/11 2024      Chief Complaint  Patient presents with  . Labor Eval   HPI 21 y.o. G6P0050 at [redacted]w[redacted]d c/o contractions ongoing x 2 days, states q 4-5 minutes lasting one minute each. No bleeding or LOF. Cervix 2 cm on prior exams. C/O back and hip pain, very uncomfortable, can't sleep. Also confused about due date - thinks her EDD should be 12/5 based on 25 week u/s, but was assigned EDD of 12/11 based on LMP.    Past Medical History  Diagnosis Date  . No pertinent past medical history     Past Surgical History  Procedure Date  . No past surgeries     No family history on file.  History  Substance Use Topics  . Smoking status: Current Some Day Smoker -- 0.1 packs/day for 8 years    Types: Cigarettes  . Smokeless tobacco: Never Used  . Alcohol Use: No     Comment: occ    Allergies:  Allergies  Allergen Reactions  . Coconut Fatty Acids Anaphylaxis and Hives    Prescriptions prior to admission  Medication Sig Dispense Refill  . HYDROcodone-acetaminophen (LORTAB 5) 5-500 MG per tablet Take 1-2 tablets by mouth every 6 (six) hours as needed. For pain      . OVER THE COUNTER MEDICATION Take 2 capsules by mouth daily. OTC prenatal gummy vitamin        Review of Systems  Constitutional: Negative.   Respiratory: Negative.   Cardiovascular: Negative.   Gastrointestinal: Positive for abdominal pain. Negative for nausea, vomiting, diarrhea and constipation.  Genitourinary: Negative for dysuria, urgency, frequency, hematuria and flank pain.       Negative for vaginal bleeding, + cramping/contractions  Musculoskeletal: Positive for back pain and joint pain.  Neurological: Negative.   Psychiatric/Behavioral: Negative.    Physical Exam   Blood pressure 113/68, pulse 76, temperature 97.8 F (36.6 C), temperature source Oral, resp. rate 18, last menstrual  period 03/23/2011.  Physical Exam  Nursing note and vitals reviewed. Constitutional: She is oriented to person, place, and time. She appears well-developed and well-nourished. No distress.  Cardiovascular: Normal rate.   Respiratory: Effort normal.  GI: Soft. There is no tenderness.  Genitourinary: No vaginal discharge found.       Dilation: 2 Effacement (%): Thick Exam by:: Raesha Coonrod cnm   Musculoskeletal: Normal range of motion.  Neurological: She is alert and oriented to person, place, and time.  Skin: Skin is warm and dry.  Psychiatric: She has a normal mood and affect.   EFM reactive, TOCO: irritability MAU Course  Procedures Offered Ambien for therapeutic rest, pt states she can't take sleeping pills d/t history of "bad dreams" when taking sleeping medicines. Agrees to therapeutic rest with Morphine/Phenergan.      .  morphine injection  10 mg Intramuscular Once  . promethazine  12.5 mg Intramuscular Once     Assessment and Plan   1. Pelvic pain in pregnancy   2. Back pain in pregnancy   Rev'd labor precautions, f/u in clinc tomorrow as scheduled    Medication List     As of 12/20/2011  8:49 PM    CONTINUE taking these medications         LORTAB 5 5-500 MG per tablet   Generic drug: HYDROcodone-acetaminophen      OVER THE COUNTER MEDICATION  Follow-up Information    Follow up with [facility]. (as scheduled)    Contact information:   [address] [place] [address] [phone]           [name] [name] 12/20/2011, 8:41 PM

## 2011-12-20 NOTE — MAU Provider Note (Signed)
Attestation of Attending Supervision of Advanced Practitioner (CNM/NP): Evaluation and management procedures were performed by the Advanced Practitioner under my supervision and collaboration.  I have reviewed the Advanced Practitioner's note and chart, and I agree with the management and plan.  HARRAWAY-SMITH, Gauge Winski 8:50 PM     

## 2011-12-21 ENCOUNTER — Ambulatory Visit (INDEPENDENT_AMBULATORY_CARE_PROVIDER_SITE_OTHER): Payer: Self-pay | Admitting: Obstetrics and Gynecology

## 2011-12-21 ENCOUNTER — Encounter: Payer: Self-pay | Admitting: Obstetrics and Gynecology

## 2011-12-21 VITALS — BP 112/66 | Temp 97.8°F | Wt 165.8 lb

## 2011-12-21 DIAGNOSIS — Z22330 Carrier of Group B streptococcus: Secondary | ICD-10-CM

## 2011-12-21 DIAGNOSIS — O0930 Supervision of pregnancy with insufficient antenatal care, unspecified trimester: Secondary | ICD-10-CM

## 2011-12-21 DIAGNOSIS — O09899 Supervision of other high risk pregnancies, unspecified trimester: Secondary | ICD-10-CM

## 2011-12-21 DIAGNOSIS — F1910 Other psychoactive substance abuse, uncomplicated: Secondary | ICD-10-CM | POA: Insufficient documentation

## 2011-12-21 DIAGNOSIS — F1911 Other psychoactive substance abuse, in remission: Secondary | ICD-10-CM

## 2011-12-21 DIAGNOSIS — O99820 Streptococcus B carrier state complicating pregnancy: Secondary | ICD-10-CM

## 2011-12-21 LAB — POCT URINALYSIS DIP (DEVICE)
Hgb urine dipstick: NEGATIVE
Protein, ur: NEGATIVE mg/dL
Specific Gravity, Urine: 1.025 (ref 1.005–1.030)
Urobilinogen, UA: 4 mg/dL — ABNORMAL HIGH (ref 0.0–1.0)
pH: 6 (ref 5.0–8.0)

## 2011-12-21 NOTE — Patient Instructions (Addendum)
Fetal Movement Counts Patient Name: __________________________________________________ Patient Due Date: ____________________ Kick counts is highly recommended in high risk pregnancies, but it is a good idea for every pregnant woman to do. Start counting fetal movements at 28 weeks of the pregnancy. Fetal movements increase after eating a full meal or eating or drinking something sweet (the blood sugar is higher). It is also important to drink plenty of fluids (well hydrated) before doing the count. Lie on your left side because it helps with the circulation or you can sit in a comfortable chair with your arms over your belly (abdomen) with no distractions around you. DOING THE COUNT  Try to do the count the same time of day each time you do it.  Mark the day and time, then see how long it takes for you to feel 10 movements (kicks, flutters, swishes, rolls). You should have at least 10 movements within 2 hours. You will most likely feel 10 movements in much less than 2 hours. If you do not, wait an hour and count again. After a couple of days you will see a pattern.  What you are looking for is a change in the pattern or not enough counts in 2 hours. Is it taking longer in time to reach 10 movements? SEEK MEDICAL CARE IF:  You feel less than 10 counts in 2 hours. Tried twice.  No movement in one hour.  The pattern is changing or taking longer each day to reach 10 counts in 2 hours.  You feel the baby is not moving as it usually does. Date: ____________ Movements: ____________ Start time: ____________ Finish time: ____________  Date: ____________ Movements: ____________ Start time: ____________ Finish time: ____________ Date: ____________ Movements: ____________ Start time: ____________ Finish time: ____________ Date: ____________ Movements: ____________ Start time: ____________ Finish time: ____________ Date: ____________ Movements: ____________ Start time: ____________ Finish time:  ____________ Date: ____________ Movements: ____________ Start time: ____________ Finish time: ____________ Date: ____________ Movements: ____________ Start time: ____________ Finish time: ____________ Date: ____________ Movements: ____________ Start time: ____________ Finish time: ____________  Date: ____________ Movements: ____________ Start time: ____________ Finish time: ____________ Date: ____________ Movements: ____________ Start time: ____________ Finish time: ____________ Date: ____________ Movements: ____________ Start time: ____________ Finish time: ____________ Date: ____________ Movements: ____________ Start time: ____________ Finish time: ____________ Date: ____________ Movements: ____________ Start time: ____________ Finish time: ____________ Date: ____________ Movements: ____________ Start time: ____________ Finish time: ____________ Date: ____________ Movements: ____________ Start time: ____________ Finish time: ____________  Date: ____________ Movements: ____________ Start time: ____________ Finish time: ____________ Date: ____________ Movements: ____________ Start time: ____________ Finish time: ____________ Date: ____________ Movements: ____________ Start time: ____________ Finish time: ____________ Date: ____________ Movements: ____________ Start time: ____________ Finish time: ____________ Date: ____________ Movements: ____________ Start time: ____________ Finish time: ____________ Date: ____________ Movements: ____________ Start time: ____________ Finish time: ____________ Date: ____________ Movements: ____________ Start time: ____________ Finish time: ____________  Date: ____________ Movements: ____________ Start time: ____________ Finish time: ____________ Date: ____________ Movements: ____________ Start time: ____________ Finish time: ____________ Date: ____________ Movements: ____________ Start time: ____________ Finish time: ____________ Date: ____________ Movements:  ____________ Start time: ____________ Finish time: ____________ Date: ____________ Movements: ____________ Start time: ____________ Finish time: ____________ Date: ____________ Movements: ____________ Start time: ____________ Finish time: ____________ Date: ____________ Movements: ____________ Start time: ____________ Finish time: ____________  Date: ____________ Movements: ____________ Start time: ____________ Finish time: ____________ Date: ____________ Movements: ____________ Start time: ____________ Finish time: ____________ Date: ____________ Movements: ____________ Start time: ____________ Finish time: ____________ Date: ____________ Movements:   ____________ Start time: ____________ Finish time: ____________ Date: ____________ Movements: ____________ Start time: ____________ Finish time: ____________ Date: ____________ Movements: ____________ Start time: ____________ Finish time: ____________ Date: ____________ Movements: ____________ Start time: ____________ Finish time: ____________  Date: ____________ Movements: ____________ Start time: ____________ Finish time: ____________ Date: ____________ Movements: ____________ Start time: ____________ Finish time: ____________ Date: ____________ Movements: ____________ Start time: ____________ Finish time: ____________ Date: ____________ Movements: ____________ Start time: ____________ Finish time: ____________ Date: ____________ Movements: ____________ Start time: ____________ Finish time: ____________ Date: ____________ Movements: ____________ Start time: ____________ Finish time: ____________ Date: ____________ Movements: ____________ Start time: ____________ Finish time: ____________  Date: ____________ Movements: ____________ Start time: ____________ Finish time: ____________ Date: ____________ Movements: ____________ Start time: ____________ Finish time: ____________ Date: ____________ Movements: ____________ Start time: ____________ Finish  time: ____________ Date: ____________ Movements: ____________ Start time: ____________ Finish time: ____________ Date: ____________ Movements: ____________ Start time: ____________ Finish time: ____________ Date: ____________ Movements: ____________ Start time: ____________ Finish time: ____________ Date: ____________ Movements: ____________ Start time: ____________ Finish time: ____________  Date: ____________ Movements: ____________ Start time: ____________ Finish time: ____________ Date: ____________ Movements: ____________ Start time: ____________ Finish time: ____________ Date: ____________ Movements: ____________ Start time: ____________ Finish time: ____________ Date: ____________ Movements: ____________ Start time: ____________ Finish time: ____________ Date: ____________ Movements: ____________ Start time: ____________ Finish time: ____________ Date: ____________ Movements: ____________ Start time: ____________ Finish time: ____________ Document Released: 02/02/2006 Document Revised: 03/28/2011 Document Reviewed: 08/05/2008 Exit[employer] Patient Information 2013 [employer].  

## 2011-12-21 NOTE — Progress Notes (Signed)
P = 77 Contractions q 4-5 minutes Was in MAU recently dilated 2 cm

## 2011-12-21 NOTE — Progress Notes (Signed)
Wants analgesic. Had morphine in MAU. Ambien not tolerated. Discussed relaxation, high dose acetaminophen for B- H UC pain. UDS was neg in Nov. S/Sx labor reviewed

## 2011-12-22 ENCOUNTER — Encounter (HOSPITAL_COMMUNITY): Payer: Self-pay

## 2011-12-22 ENCOUNTER — Inpatient Hospital Stay (HOSPITAL_COMMUNITY)
Admission: AD | Admit: 2011-12-22 | Discharge: 2011-12-22 | Disposition: A | Discharge status: Home / Self Care | Payer: Medicaid Other | Source: Ambulatory Visit | Attending: Obstetrics & Gynecology | Admitting: Obstetrics & Gynecology

## 2011-12-22 DIAGNOSIS — O479 False labor, unspecified: Secondary | ICD-10-CM | POA: Insufficient documentation

## 2011-12-22 MED ORDER — MORPHINE SULFATE 4 MG/ML IJ SOLN
4.0000 mg | INTRAMUSCULAR | Status: AC
  Administered 2011-12-22: 4 mg via INTRAMUSCULAR
  Filled 2011-12-22: qty 1

## 2011-12-22 MED ORDER — PROMETHAZINE HCL 25 MG/ML IJ SOLN
12.5000 mg | Freq: Once | INTRAMUSCULAR | Status: AC
  Administered 2011-12-22: 12.5 mg via INTRAMUSCULAR
  Filled 2011-12-22: qty 1

## 2011-12-22 NOTE — MAU Provider Note (Signed)
  History     CSN: [id number]  Arrival date and time: 12/22/11 0903   None     Chief Complaint  Patient presents with  . Labor Eval   HPI  Pt is a G6P0050 here at 39.1 wks IUP with c/o contractions, hip/pelvic pain that "makes her legs give out". Patient states that she loses her stance when she gets the sharp hip pain. She denies vaginal bleeding or lof. She reports good fetal movement. She c/o insomnia since Tuesday. Patient states that she have had 4 hours sleep that was after we gave her morphine and phenergan   Past Medical History  Diagnosis Date  . No pertinent past medical history     Past Surgical History  Procedure Date  . No past surgeries   . Dilation and curettage of uterus     TAB     History reviewed. No pertinent family history.  History  Substance Use Topics  . Smoking status: Current Some Day Smoker -- 0.1 packs/day for 8 years    Types: Cigarettes  . Smokeless tobacco: Never Used  . Alcohol Use: No     Comment: occ    Allergies:  Allergies  Allergen Reactions  . Coconut Fatty Acids Anaphylaxis and Hives    Prescriptions prior to admission  Medication Sig Dispense Refill  . OVER THE COUNTER MEDICATION Take 2 capsules by mouth daily. OTC prenatal gummy vitamin        Review of Systems  Gastrointestinal: Positive for abdominal pain (contractions).  All other systems reviewed and are negative.   Physical Exam   Blood pressure 119/68, pulse 77, temperature 97.4 F (36.3 C), temperature source Oral, resp. rate 18, last menstrual period 03/23/2011.  Physical Exam  Constitutional: She is oriented to person, place, and time. She appears well-developed and well-nourished. No distress.  HENT:  Head: Normocephalic.  Neck: Normal range of motion. Neck supple.  Cardiovascular: Normal rate, regular rhythm and normal heart sounds.   Respiratory: Effort normal and breath sounds normal.  GI: Soft. There is no tenderness.  Genitourinary: No  bleeding around the vagina. Vaginal discharge (mucusy) found.  Neurological: She is alert and oriented to person, place, and time.  Skin: Skin is warm and dry.   Dilation: 1.5 Effacement (%): 20 Cervical Position: Posterior Station: -3 Presentation: Vertex Exam by:: Roney Marion, CNM  MAU Course  Procedures  Morphine 4 mg IM/Phenergan 12.5 IM  FHR 130's, +accels, reactive Toco - irreguar  Assessment and Plan  False Labor  Plan: DC to home Provided reassurance Reviewed signs of ACTIVE labor.  [facility] 12/22/2011, 10:10 AM

## 2011-12-22 NOTE — MAU Note (Signed)
Patient is in with c/o contractions, hip/pelvic pain that "makes her legs give out". Patient states that she loses her stance when she gets the sharp hip pain. She denies vaginal bleeding or lof. She reports good fetal movement. She c/o insomnia since Tuesday. Patient states that she have had 4 hours sleep that was after we gave her morphine and phenergan.

## 2011-12-25 ENCOUNTER — Inpatient Hospital Stay (HOSPITAL_COMMUNITY)
Admission: AD | Admit: 2011-12-25 | Discharge: 2011-12-25 | Disposition: A | Discharge status: Home / Self Care | Payer: Medicaid Other | Source: Ambulatory Visit | Attending: Obstetrics & Gynecology | Admitting: Obstetrics & Gynecology

## 2011-12-25 ENCOUNTER — Encounter (HOSPITAL_COMMUNITY): Payer: Self-pay | Admitting: *Deleted

## 2011-12-25 DIAGNOSIS — O99891 Other specified diseases and conditions complicating pregnancy: Secondary | ICD-10-CM | POA: Insufficient documentation

## 2011-12-25 DIAGNOSIS — O479 False labor, unspecified: Secondary | ICD-10-CM

## 2011-12-25 DIAGNOSIS — M25559 Pain in unspecified hip: Secondary | ICD-10-CM | POA: Insufficient documentation

## 2011-12-25 DIAGNOSIS — R109 Unspecified abdominal pain: Secondary | ICD-10-CM | POA: Insufficient documentation

## 2011-12-25 NOTE — MAU Note (Signed)
Patient states she thinks her hips are giving out on her, states she has had popping in the hips and fell into the closet door but did not hit her abdomen. States she has had lower abdominal pain and back pain all the time for 2 days, unable to sleep. Denies any bleeding or leaking and reports good fetal movement.

## 2011-12-28 ENCOUNTER — Inpatient Hospital Stay (HOSPITAL_COMMUNITY)
Admission: AD | Admit: 2011-12-28 | Discharge: 2011-12-28 | Disposition: A | Discharge status: Home / Self Care | Payer: Medicaid Other | Source: Ambulatory Visit | Attending: Obstetrics and Gynecology | Admitting: Obstetrics and Gynecology

## 2011-12-28 ENCOUNTER — Encounter (HOSPITAL_COMMUNITY): Payer: Self-pay | Admitting: *Deleted

## 2011-12-28 ENCOUNTER — Ambulatory Visit (INDEPENDENT_AMBULATORY_CARE_PROVIDER_SITE_OTHER): Payer: Self-pay | Admitting: Obstetrics and Gynecology

## 2011-12-28 VITALS — BP 122/86 | Temp 96.7°F | Wt 167.5 lb

## 2011-12-28 DIAGNOSIS — Z3400 Encounter for supervision of normal first pregnancy, unspecified trimester: Secondary | ICD-10-CM

## 2011-12-28 DIAGNOSIS — O0930 Supervision of pregnancy with insufficient antenatal care, unspecified trimester: Secondary | ICD-10-CM

## 2011-12-28 DIAGNOSIS — O471 False labor at or after 37 completed weeks of gestation: Secondary | ICD-10-CM

## 2011-12-28 DIAGNOSIS — O479 False labor, unspecified: Secondary | ICD-10-CM | POA: Insufficient documentation

## 2011-12-28 LAB — POCT URINALYSIS DIP (DEVICE)
Nitrite: NEGATIVE
Urobilinogen, UA: 0.2 mg/dL (ref 0.0–1.0)
pH: 6.5 (ref 5.0–8.0)

## 2011-12-28 MED ORDER — MORPHINE SULFATE 10 MG/ML IJ SOLN
10.0000 mg | Freq: Once | INTRAMUSCULAR | Status: AC
  Administered 2011-12-28: 10 mg via INTRAMUSCULAR
  Filled 2011-12-28: qty 1

## 2011-12-28 MED ORDER — PROMETHAZINE HCL 25 MG/ML IJ SOLN
25.0000 mg | Freq: Once | INTRAMUSCULAR | Status: AC
  Administered 2011-12-28: 25 mg via INTRAMUSCULAR
  Filled 2011-12-28: qty 1

## 2011-12-28 NOTE — Progress Notes (Signed)
P=70   Some brown/red bleeding seen in patients underware over the last 3 days.

## 2011-12-28 NOTE — Progress Notes (Signed)
Anxious for labor; discomforts. Swept membranes > sl show. No H/A, visual disturbances. BP recheck:

## 2011-12-28 NOTE — MAU Note (Signed)
Pt G6 P0 at 40wks having back and hip pain, passing bloody show.  Pt unable to sleep x 2 days.

## 2011-12-28 NOTE — Progress Notes (Signed)
Re-check bp 118/80

## 2011-12-28 NOTE — MAU Provider Note (Signed)
  History     CSN: [id number]  Arrival date and time: 12/28/11 4098   First Provider Initiated Contact with Patient 12/28/11 2057      Chief Complaint  Patient presents with  . Labor Eval   HPI  [name] [name] is a 21 y.o. G6P0050 at [redacted]w[redacted]d who presents today "because it is her due date and everything hurts". She states her back hurts, her hips hurt, her legs hurt. She thinks she is having some contractions but she isn't sure.   Past Medical History  Diagnosis Date  . No pertinent past medical history     Past Surgical History  Procedure Date  . No past surgeries   . Dilation and curettage of uterus     TAB     History reviewed. No pertinent family history.  History  Substance Use Topics  . Smoking status: Current Some Day Smoker -- 0.1 packs/day for 8 years    Types: Cigarettes  . Smokeless tobacco: Never Used  . Alcohol Use: No     Comment: occ    Allergies:  Allergies  Allergen Reactions  . Coconut Fatty Acids Anaphylaxis and Hives    Prescriptions prior to admission  Medication Sig Dispense Refill  . OVER THE COUNTER MEDICATION Take 2 capsules by mouth daily. OTC prenatal gummy vitamin        Review of Systems  Constitutional: Negative for fever and chills.  Eyes: Negative for blurred vision.  Gastrointestinal: Negative for nausea, vomiting, abdominal pain, diarrhea and constipation.  Genitourinary: Negative for dysuria, urgency and frequency.  Neurological: Negative for dizziness and headaches.   Physical Exam   Blood pressure 138/84, pulse 71, temperature 97.9 F (36.6 C), temperature source Oral, resp. rate 18, height 5\' 4"  (1.626 m), weight 77.293 kg (170 lb 6.4 oz), last menstrual period 03/23/2011.  Physical Exam  Nursing note and vitals reviewed. Constitutional: She is oriented to person, place, and time. She appears well-developed and well-nourished. No distress.  Cardiovascular: Normal rate.   Respiratory: Effort normal.  GI: Soft. There  is no tenderness.  Genitourinary:        FHT: 145 moderate variability, 15X15 accels. No Decels Toco: some UI and an occasional random contraction.   Neurological: She is alert and oriented to person, place, and time.  Skin: Skin is warm and dry.  Psychiatric: She has a normal mood and affect.    MAU Course  Procedures  Pt reports adequate relief with therapeutic rest Plan to send her home to sleep  Assessment and Plan   1. False labor after 37 completed weeks of gestation    Labor precautions given Third trimester danger signs Return to clinic as scheduled  Tawnya Crook 12/28/2011, 9:05 PM

## 2011-12-28 NOTE — Progress Notes (Signed)
Recheck BP

## 2011-12-28 NOTE — Patient Instructions (Signed)
Fetal Movement Counts Patient Name: __________________________________________________ Patient Due Date: ____________________ Kick counts is highly recommended in high risk pregnancies, but it is a good idea for every pregnant woman to do. Start counting fetal movements at 28 weeks of the pregnancy. Fetal movements increase after eating a full meal or eating or drinking something sweet (the blood sugar is higher). It is also important to drink plenty of fluids (well hydrated) before doing the count. Lie on your left side because it helps with the circulation or you can sit in a comfortable chair with your arms over your belly (abdomen) with no distractions around you. DOING THE COUNT  Try to do the count the same time of day each time you do it.  Mark the day and time, then see how long it takes for you to feel 10 movements (kicks, flutters, swishes, rolls). You should have at least 10 movements within 2 hours. You will most likely feel 10 movements in much less than 2 hours. If you do not, wait an hour and count again. After a couple of days you will see a pattern.  What you are looking for is a change in the pattern or not enough counts in 2 hours. Is it taking longer in time to reach 10 movements? SEEK MEDICAL CARE IF:  You feel less than 10 counts in 2 hours. Tried twice.  No movement in one hour.  The pattern is changing or taking longer each day to reach 10 counts in 2 hours.  You feel the baby is not moving as it usually does. Date: ____________ Movements: ____________ Start time: ____________ Finish time: ____________  Date: ____________ Movements: ____________ Start time: ____________ Finish time: ____________ Date: ____________ Movements: ____________ Start time: ____________ Finish time: ____________ Date: ____________ Movements: ____________ Start time: ____________ Finish time: ____________ Date: ____________ Movements: ____________ Start time: ____________ Finish time:  ____________ Date: ____________ Movements: ____________ Start time: ____________ Finish time: ____________ Date: ____________ Movements: ____________ Start time: ____________ Finish time: ____________ Date: ____________ Movements: ____________ Start time: ____________ Finish time: ____________  Date: ____________ Movements: ____________ Start time: ____________ Finish time: ____________ Date: ____________ Movements: ____________ Start time: ____________ Finish time: ____________ Date: ____________ Movements: ____________ Start time: ____________ Finish time: ____________ Date: ____________ Movements: ____________ Start time: ____________ Finish time: ____________ Date: ____________ Movements: ____________ Start time: ____________ Finish time: ____________ Date: ____________ Movements: ____________ Start time: ____________ Finish time: ____________ Date: ____________ Movements: ____________ Start time: ____________ Finish time: ____________  Date: ____________ Movements: ____________ Start time: ____________ Finish time: ____________ Date: ____________ Movements: ____________ Start time: ____________ Finish time: ____________ Date: ____________ Movements: ____________ Start time: ____________ Finish time: ____________ Date: ____________ Movements: ____________ Start time: ____________ Finish time: ____________ Date: ____________ Movements: ____________ Start time: ____________ Finish time: ____________ Date: ____________ Movements: ____________ Start time: ____________ Finish time: ____________ Date: ____________ Movements: ____________ Start time: ____________ Finish time: ____________  Date: ____________ Movements: ____________ Start time: ____________ Finish time: ____________ Date: ____________ Movements: ____________ Start time: ____________ Finish time: ____________ Date: ____________ Movements: ____________ Start time: ____________ Finish time: ____________ Date: ____________ Movements:  ____________ Start time: ____________ Finish time: ____________ Date: ____________ Movements: ____________ Start time: ____________ Finish time: ____________ Date: ____________ Movements: ____________ Start time: ____________ Finish time: ____________ Date: ____________ Movements: ____________ Start time: ____________ Finish time: ____________  Date: ____________ Movements: ____________ Start time: ____________ Finish time: ____________ Date: ____________ Movements: ____________ Start time: ____________ Finish time: ____________ Date: ____________ Movements: ____________ Start time: ____________ Finish time: ____________ Date: ____________ Movements:   ____________ Start time: ____________ Finish time: ____________ Date: ____________ Movements: ____________ Start time: ____________ Finish time: ____________ Date: ____________ Movements: ____________ Start time: ____________ Finish time: ____________ Date: ____________ Movements: ____________ Start time: ____________ Finish time: ____________  Date: ____________ Movements: ____________ Start time: ____________ Finish time: ____________ Date: ____________ Movements: ____________ Start time: ____________ Finish time: ____________ Date: ____________ Movements: ____________ Start time: ____________ Finish time: ____________ Date: ____________ Movements: ____________ Start time: ____________ Finish time: ____________ Date: ____________ Movements: ____________ Start time: ____________ Finish time: ____________ Date: ____________ Movements: ____________ Start time: ____________ Finish time: ____________ Date: ____________ Movements: ____________ Start time: ____________ Finish time: ____________  Date: ____________ Movements: ____________ Start time: ____________ Finish time: ____________ Date: ____________ Movements: ____________ Start time: ____________ Finish time: ____________ Date: ____________ Movements: ____________ Start time: ____________ Finish  time: ____________ Date: ____________ Movements: ____________ Start time: ____________ Finish time: ____________ Date: ____________ Movements: ____________ Start time: ____________ Finish time: ____________ Date: ____________ Movements: ____________ Start time: ____________ Finish time: ____________ Date: ____________ Movements: ____________ Start time: ____________ Finish time: ____________  Date: ____________ Movements: ____________ Start time: ____________ Finish time: ____________ Date: ____________ Movements: ____________ Start time: ____________ Finish time: ____________ Date: ____________ Movements: ____________ Start time: ____________ Finish time: ____________ Date: ____________ Movements: ____________ Start time: ____________ Finish time: ____________ Date: ____________ Movements: ____________ Start time: ____________ Finish time: ____________ Date: ____________ Movements: ____________ Start time: ____________ Finish time: ____________ Document Released: 02/02/2006 Document Revised: 03/28/2011 Document Reviewed: 08/05/2008 [employer] Patient Information 2013 [employer].  Braxton Hicks Contractions Pregnancy is commonly associated with contractions of the uterus throughout the pregnancy. Towards the end of pregnancy (32 to 34 weeks), these contractions (Braxton Hicks) can develop more often and may become more forceful. This is not true labor because these contractions do not result in opening (dilatation) and thinning of the cervix. They are sometimes difficult to tell apart from true labor because these contractions can be forceful and people have different pain tolerances. You should not feel embarrassed if you go to the hospital with false labor. Sometimes, the only way to tell if you are in true labor is for your caregiver to follow the changes in the cervix. How to tell the difference between true and false labor:  False labor.  The contractions of false labor are usually  shorter, irregular and not as hard as those of true labor.  They are often felt in the front of the lower abdomen and in the groin.  They may leave with walking around or changing positions while lying down.  They get weaker and are shorter lasting as time goes on.  These contractions are usually irregular.  They do not usually become progressively stronger, regular and closer together as with true labor.  True labor.  Contractions in true labor last 30 to 70 seconds, become very regular, usually become more intense, and increase in frequency.  They do not go away with walking.  The discomfort is usually felt in the top of the uterus and spreads to the lower abdomen and low back.  True labor can be determined by your caregiver with an exam. This will show that the cervix is dilating and getting thinner. If there are no prenatal problems or other health problems associated with the pregnancy, it is completely safe to be sent home with false labor and await the onset of true labor. HOME CARE INSTRUCTIONS   Keep up with your usual exercises and instructions.  Take medications as directed.  Keep your regular prenatal appointment.    Eat and drink lightly if you think you are going into labor.  If BH contractions are making you uncomfortable:  Change your activity position from lying down or resting to walking/walking to resting.  Sit and rest in a tub of warm water.  Drink 2 to 3 glasses of water. Dehydration may cause B-H contractions.  Do slow and deep breathing several times an hour. SEEK IMMEDIATE MEDICAL CARE IF:   Your contractions continue to become stronger, more regular, and closer together.  You have a gushing, burst or leaking of fluid from the vagina.  An oral temperature above 102 F (38.9 C) develops.  You have passage of blood-tinged mucus.  You develop vaginal bleeding.  You develop continuous belly (abdominal) pain.  You have low back pain that you  never had before.  You feel the baby's head pushing down causing pelvic pressure.  The baby is not moving as much as it used to. Document Released: 01/03/2005 Document Revised: 03/28/2011 Document Reviewed: 06/27/2008 [employer] Patient Information 2013 [employer].  

## 2011-12-29 ENCOUNTER — Inpatient Hospital Stay (HOSPITAL_COMMUNITY)
Admission: AD | Admit: 2011-12-29 | Discharge: 2011-12-29 | Disposition: A | Discharge status: Home / Self Care | Payer: Medicaid Other | Source: Ambulatory Visit | Attending: Obstetrics & Gynecology | Admitting: Obstetrics & Gynecology

## 2011-12-29 ENCOUNTER — Encounter (HOSPITAL_COMMUNITY): Payer: Self-pay | Admitting: *Deleted

## 2011-12-29 DIAGNOSIS — O479 False labor, unspecified: Secondary | ICD-10-CM

## 2011-12-29 DIAGNOSIS — R109 Unspecified abdominal pain: Secondary | ICD-10-CM | POA: Insufficient documentation

## 2011-12-29 DIAGNOSIS — O99891 Other specified diseases and conditions complicating pregnancy: Secondary | ICD-10-CM | POA: Insufficient documentation

## 2011-12-29 DIAGNOSIS — M545 Low back pain: Secondary | ICD-10-CM

## 2011-12-29 DIAGNOSIS — M549 Dorsalgia, unspecified: Secondary | ICD-10-CM | POA: Insufficient documentation

## 2011-12-29 DIAGNOSIS — M25559 Pain in unspecified hip: Secondary | ICD-10-CM | POA: Insufficient documentation

## 2011-12-29 MED ORDER — PROMETHAZINE HCL 12.5 MG PO TABS: 12.5000 mg | ORAL_TABLET | Freq: Four times a day (QID) | ORAL | Status: DC | PRN

## 2011-12-29 MED ORDER — PROMETHAZINE HCL 25 MG PO TABS
25.0000 mg | ORAL_TABLET | Freq: Once | ORAL | Status: AC
  Administered 2011-12-29: 25 mg via ORAL
  Filled 2011-12-29: qty 1

## 2011-12-29 NOTE — Progress Notes (Signed)
None     Chief Complaint:  Back Pain and can't sleep  Anzal Bartnick is  21 y.o. G6P0050 at [redacted]w[redacted]d presents complaining of Back Pain .  She states none contractions are associated with none vaginal bleeding, intact membranes, along with active fetal movement. Pt is here requesting pain medication (morphine or Vicodin) because she is "so uncomfortable and can't sleep".  Pt has been to the MAU 10 times in the past month and received narcotics.  Additionally, in October, pt went to 5 different doctors in 21 days and received prescriptions for oxy- and hydrocodone each time; therefore, there is a concern that pt is drug seeking. Obstetrical/Gynecological History: Menstrual History: OB History    Grav Para Term Preterm Abortions TAB SAB Ect Mult Living   6 0 0 0 5 1 4 0 0 0        Patient's last menstrual period was 03/23/2011.     Past Medical History: Past Medical History  Diagnosis Date  . No pertinent past medical history     Past Surgical History: Past Surgical History  Procedure Date  . No past surgeries   . Dilation and curettage of uterus     TAB     Family History: History reviewed. No pertinent family history.  Social History: History  Substance Use Topics  . Smoking status: Current Some Day Smoker -- 0.1 packs/day for 8 years    Types: Cigarettes  . Smokeless tobacco: Never Used  . Alcohol Use: No     Comment: occ    Allergies:  Allergies  Allergen Reactions  . Coconut Fatty Acids Anaphylaxis and Hives    Meds:  Prescriptions prior to admission  Medication Sig Dispense Refill  . Prenatal Vit-Fe Fumarate-FA (PRENATAL MULTIVITAMIN) TABS Take 1 tablet by mouth daily.        Review of Systems - Please refer to the aforementioned patients' reports.     Physical Exam  Blood pressure 120/74, pulse 80, temperature 96.7 F (35.9 C), temperature source Axillary, resp. rate 18, height 5\' 3"  (1.6 m), weight 168 lb 6.4 oz (76.386 kg), last menstrual period  03/23/2011. GENERAL: Well-developed, well-nourished female in no acute distress.  LUNGS: Clear to auscultation bilaterally.  HEART: Regular rate and rhythm. ABDOMEN: Soft, nontender, nondistended, gravid.  EXTREMITIES: Nontender, no edema, 2+ distal pulses. CERVICAL EXAM: Dilatation 2 cm   Effacement 40%   Station -2 ( no change)   Presentation: cephalic FHT:  Baseline rate 140 bpm   Variability moderate  Accelerations present   Decelerations none Contractions: 1 contraction   Labs: No results found for this or any previous visit (from the past 24 hour(s)). Imaging Studies:  No results found.  Assessment: Trayce Caravello is  21 y.o. G6P0050 at [redacted]w[redacted]d presents with pains of pregnancy, no evidence of labor, possible drug seeking behavior  Plan: Pt offered ambien (gives nightmares), benadryl (doesn't work), [employer] (doesn't work) for sleep. Agreed upon phenergan 25 mg PO q hs for sleep  CRESENZO-DISHMAN,Cleave Ternes 12/12/201310:05 PM

## 2011-12-29 NOTE — MAU Provider Note (Signed)
Attestation of Attending Supervision of Advanced Practitioner: Evaluation and management procedures were performed by the PA/NP/CNM/OB Fellow under my supervision/collaboration. Chart reviewed and agree with management and plan.  Orena Cavazos V 12/29/2011 5:32 AM    

## 2011-12-29 NOTE — MAU Note (Signed)
Pt G6 P0 at 40.1wks reports back pain, abd pain, hip pain.  Pt seen in MAU last night, does not understand why the doctors are not inducing her.

## 2012-01-02 ENCOUNTER — Inpatient Hospital Stay (HOSPITAL_COMMUNITY)
Admission: AD | Admit: 2012-01-02 | Discharge: 2012-01-05 | DRG: 775 | Disposition: A | Discharge status: Home / Self Care | Payer: Medicaid Other | Source: Ambulatory Visit | Attending: Obstetrics & Gynecology | Admitting: Obstetrics & Gynecology

## 2012-01-02 ENCOUNTER — Inpatient Hospital Stay (HOSPITAL_COMMUNITY): Payer: Medicaid Other | Admitting: Anesthesiology

## 2012-01-02 ENCOUNTER — Encounter (HOSPITAL_COMMUNITY): Payer: Self-pay | Admitting: Anesthesiology

## 2012-01-02 ENCOUNTER — Encounter (HOSPITAL_COMMUNITY): Payer: Self-pay

## 2012-01-02 DIAGNOSIS — IMO0001 Reserved for inherently not codable concepts without codable children: Secondary | ICD-10-CM

## 2012-01-02 DIAGNOSIS — Z22330 Carrier of Group B streptococcus: Secondary | ICD-10-CM

## 2012-01-02 DIAGNOSIS — O99892 Other specified diseases and conditions complicating childbirth: Principal | ICD-10-CM | POA: Diagnosis present

## 2012-01-02 LAB — CBC
HCT: 30.9 % — ABNORMAL LOW (ref 36.0–46.0)
Hemoglobin: 9.7 g/dL — ABNORMAL LOW (ref 12.0–15.0)
WBC: 13.1 10*3/uL — ABNORMAL HIGH (ref 4.0–10.5)

## 2012-01-02 LAB — URINALYSIS, ROUTINE W REFLEX MICROSCOPIC
Glucose, UA: NEGATIVE mg/dL
Protein, ur: NEGATIVE mg/dL
Urobilinogen, UA: 1 mg/dL (ref 0.0–1.0)

## 2012-01-02 LAB — RAPID URINE DRUG SCREEN, HOSP PERFORMED
Amphetamines: NOT DETECTED
Barbiturates: NOT DETECTED
Cocaine: NOT DETECTED
Tetrahydrocannabinol: NOT DETECTED

## 2012-01-02 MED ORDER — OXYTOCIN 40 UNITS IN LACTATED RINGERS INFUSION - SIMPLE MED
1.0000 mL/h | INTRAVENOUS | Status: DC
  Administered 2012-01-02: 2 mL/h via INTRAVENOUS
  Filled 2012-01-02: qty 1000

## 2012-01-02 MED ORDER — EPHEDRINE 5 MG/ML INJ
10.0000 mg | INTRAVENOUS | Status: DC | PRN
  Administered 2012-01-02: 10 mg via INTRAVENOUS
  Filled 2012-01-02 (×2): qty 4

## 2012-01-02 MED ORDER — PHENYLEPHRINE 40 MCG/ML (10ML) SYRINGE FOR IV PUSH (FOR BLOOD PRESSURE SUPPORT)
80.0000 ug | PREFILLED_SYRINGE | INTRAVENOUS | Status: DC | PRN
  Filled 2012-01-02: qty 5

## 2012-01-02 MED ORDER — LACTATED RINGERS IV SOLN: INTRAVENOUS | Status: DC

## 2012-01-02 MED ORDER — FENTANYL 2.5 MCG/ML BUPIVACAINE 1/10 % EPIDURAL INFUSION (WH - ANES)
INTRAMUSCULAR | Status: DC | PRN
  Administered 2012-01-02: 14 mL/h via EPIDURAL

## 2012-01-02 MED ORDER — OXYCODONE-ACETAMINOPHEN 5-325 MG PO TABS
1.0000 | ORAL_TABLET | ORAL | Status: DC | PRN
  Administered 2012-01-03: 1 via ORAL
  Filled 2012-01-02: qty 1

## 2012-01-02 MED ORDER — FLEET ENEMA 7-19 GM/118ML RE ENEM: 1.0000 | ENEMA | RECTAL | Status: DC | PRN

## 2012-01-02 MED ORDER — FENTANYL CITRATE 0.05 MG/ML IJ SOLN: 100.0000 ug | INTRAMUSCULAR | Status: DC | PRN

## 2012-01-02 MED ORDER — BUPIVACAINE HCL (PF) 0.25 % IJ SOLN
INTRAMUSCULAR | Status: DC | PRN
  Administered 2012-01-02 (×2): 5 mL via EPIDURAL

## 2012-01-02 MED ORDER — TERBUTALINE SULFATE 1 MG/ML IJ SOLN: 0.2500 mg | Freq: Once | INTRAMUSCULAR | Status: AC | PRN

## 2012-01-02 MED ORDER — ONDANSETRON HCL 4 MG/2ML IJ SOLN: 4.0000 mg | Freq: Four times a day (QID) | INTRAMUSCULAR | Status: DC | PRN

## 2012-01-02 MED ORDER — LACTATED RINGERS IV SOLN
INTRAVENOUS | Status: DC
  Administered 2012-01-02: 125 mL/h via INTRAVENOUS
  Administered 2012-01-02: 14:00:00 via INTRAVENOUS

## 2012-01-02 MED ORDER — EPHEDRINE 5 MG/ML INJ: 10.0000 mg | INTRAVENOUS | Status: DC | PRN

## 2012-01-02 MED ORDER — PENICILLIN G POTASSIUM 5000000 UNITS IJ SOLR
5.0000 10*6.[IU] | Freq: Once | INTRAVENOUS | Status: AC
  Administered 2012-01-02: 5 10*6.[IU] via INTRAVENOUS
  Filled 2012-01-02: qty 5

## 2012-01-02 MED ORDER — FENTANYL 2.5 MCG/ML BUPIVACAINE 1/10 % EPIDURAL INFUSION (WH - ANES)
14.0000 mL/h | INTRAMUSCULAR | Status: DC
  Filled 2012-01-02 (×3): qty 125

## 2012-01-02 MED ORDER — LACTATED RINGERS IV SOLN
500.0000 mL | Freq: Once | INTRAVENOUS | Status: AC
  Administered 2012-01-02: 500 mL via INTRAVENOUS

## 2012-01-02 MED ORDER — OXYTOCIN 40 UNITS IN LACTATED RINGERS INFUSION - SIMPLE MED: 62.5000 mL/h | INTRAVENOUS | Status: DC

## 2012-01-02 MED ORDER — DEXTROSE 5 % IV SOLN
2.5000 10*6.[IU] | INTRAVENOUS | Status: DC
  Administered 2012-01-02 – 2012-01-03 (×3): 2.5 10*6.[IU] via INTRAVENOUS
  Filled 2012-01-02 (×7): qty 2.5

## 2012-01-02 MED ORDER — PROMETHAZINE HCL 25 MG PO TABS
25.0000 mg | ORAL_TABLET | Freq: Four times a day (QID) | ORAL | Status: DC | PRN
  Administered 2012-01-02: 50 mg via ORAL
  Filled 2012-01-02: qty 1

## 2012-01-02 MED ORDER — PROMETHAZINE HCL 25 MG/ML IJ SOLN
25.0000 mg | Freq: Once | INTRAMUSCULAR | Status: AC
  Administered 2012-01-02: 25 mg via INTRAVENOUS
  Filled 2012-01-02: qty 1

## 2012-01-02 MED ORDER — LIDOCAINE HCL (PF) 1 % IJ SOLN
INTRAMUSCULAR | Status: DC | PRN
  Administered 2012-01-02 (×2): 4 mL

## 2012-01-02 MED ORDER — ACETAMINOPHEN 325 MG PO TABS: 650.0000 mg | ORAL_TABLET | ORAL | Status: DC | PRN

## 2012-01-02 MED ORDER — IBUPROFEN 600 MG PO TABS
600.0000 mg | ORAL_TABLET | Freq: Four times a day (QID) | ORAL | Status: DC | PRN
  Administered 2012-01-03: 600 mg via ORAL
  Filled 2012-01-02: qty 1

## 2012-01-02 MED ORDER — CITRIC ACID-SODIUM CITRATE 334-500 MG/5ML PO SOLN: 30.0000 mL | ORAL | Status: DC | PRN

## 2012-01-02 MED ORDER — LACTATED RINGERS IV SOLN
500.0000 mL | INTRAVENOUS | Status: DC | PRN
  Administered 2012-01-02: 500 mL via INTRAVENOUS

## 2012-01-02 MED ORDER — OXYTOCIN BOLUS FROM INFUSION
500.0000 mL | INTRAVENOUS | Status: DC
  Administered 2012-01-03: 500 mL via INTRAVENOUS

## 2012-01-02 MED ORDER — LIDOCAINE HCL (PF) 1 % IJ SOLN
30.0000 mL | INTRAMUSCULAR | Status: DC | PRN
  Filled 2012-01-02: qty 30

## 2012-01-02 MED ORDER — DIPHENHYDRAMINE HCL 50 MG/ML IJ SOLN: 12.5000 mg | INTRAMUSCULAR | Status: DC | PRN

## 2012-01-02 NOTE — Anesthesia Procedure Notes (Signed)
Epidural Patient location during procedure: OB Start time: 01/02/2012 2:08 PM  Staffing Anesthesiologist: Amabel Stmarie A. Performed by: anesthesiologist   Preanesthetic Checklist Completed: patient identified, site marked, surgical consent, pre-op evaluation, timeout performed, IV checked, risks and benefits discussed and monitors and equipment checked  Epidural Patient position: sitting Prep: site prepped and draped and DuraPrep Patient monitoring: continuous pulse ox and blood pressure Approach: midline Injection technique: LOR air  Needle:  Needle type: Tuohy  Needle gauge: 17 G Needle length: 9 cm and 9 Needle insertion depth: 5 cm cm Catheter type: closed end flexible Catheter size: 19 Gauge Catheter at skin depth: 10 cm Test dose: negative and Other  Assessment Events: blood not aspirated, injection not painful, no injection resistance, negative IV test and no paresthesia  Additional Notes Patient identified. Risks and benefits discussed including failed block, incomplete  Pain control, post dural puncture headache, nerve damage, paralysis, blood pressure Changes, nausea, vomiting, reactions to medications-both toxic and allergic and post Partum back pain. All questions were answered. Patient expressed understanding and wished to proceed. Sterile technique was used throughout procedure. Epidural site was Dressed with sterile barrier dressing. No paresthesias, signs of intravascular injection Or signs of intrathecal spread were encountered.  Patient was more comfortable after the epidural was dosed. Please see RN's note for documentation of vital signs and FHR which are stable.

## 2012-01-02 NOTE — Anesthesia Preprocedure Evaluation (Signed)
Anesthesia Evaluation  Patient identified by MRN, date of birth, ID band Patient awake    Reviewed: Allergy & Precautions, H&P , Patient's Chart, lab work & pertinent test results  Airway Mallampati: III TM Distance: >3 FB Neck ROM: full    Dental No notable dental hx. (+) Teeth Intact   Pulmonary Current Smoker,  breath sounds clear to auscultation  Pulmonary exam normal       Cardiovascular negative cardio ROS  Rhythm:regular Rate:Normal     Neuro/Psych negative neurological ROS  negative psych ROS   GI/Hepatic negative GI ROS, (+)     substance abuse   ,   Endo/Other  negative endocrine ROS  Renal/GU negative Renal ROS  negative genitourinary   Musculoskeletal   Abdominal Normal abdominal exam  (+)   Peds  Hematology negative hematology ROS (+)   Anesthesia Other Findings   Reproductive/Obstetrics (+) Pregnancy                           Anesthesia Physical Anesthesia Plan  ASA: II  Anesthesia Plan: Epidural   Post-op Pain Management:    Induction:   Airway Management Planned:   Additional Equipment:   Intra-op Plan:   Post-operative Plan:   Informed Consent: I have reviewed the patients History and Physical, chart, labs and discussed the procedure including the risks, benefits and alternatives for the proposed anesthesia with the patient or authorized representative who has indicated his/her understanding and acceptance.     Plan Discussed with: Anesthesiologist  Anesthesia Plan Comments:         Anesthesia Quick Evaluation

## 2012-01-02 NOTE — Progress Notes (Signed)
Patient ID: [name] [name], female   DOB: 25-Jan-1990, 21 y.o.   MRN: [id number]   S:   Pt has epidural, comfortable, some nausea  O:  Filed Vitals:   01/02/12 1502 01/02/12 1534 01/02/12 1602 01/02/12 1632  BP: 118/79 105/63 107/54 120/80  Pulse: 91 81 59 68  Temp:   98.2 F (36.8 C)   TempSrc:   Oral   Resp: 18 18 18 18   Height:      Weight:        Cervix:  4/70/-2 (unchanged) AROM, clear, scant fluid  FHTs: 135, moderate variability, accels present, Decel at 15:52 to 70, 2 minutes with good recovery (up to 90s in 15 sec). Few other decels to 120s, 1 minute with contractions Not picking up ctx well  IUPC placed   A/P 21 y.o. G6P0050 at [redacted]w[redacted]d with SOL - Latent phase, AROM - clear - IUPC placed - FHTs cat II - monitor closely  Napoleon Form,  01/02/2012

## 2012-01-02 NOTE — H&P (Signed)
I have seen and examined patient and agree with above. Tocometry not picking up contractions well, but pt appears to be in early labor. Napoleon Form, MD

## 2012-01-02 NOTE — H&P (Signed)
[name] [name] is a 21 y.o. female G6P0050 at [redacted]w[redacted]d by LMP presenting with contractions and severe back pain for about the last 12 hours. Reports contractions are very painful and "different from the pain she has had" on her visits here in the past two weeks. Back pain is described as sharp, achey, in time with contractions, worse when she lies flat. Pt has also had significant nausea and vomiting while here in the MAU. Good fetal movements. No bleeding or LOF.  Otherwise has few complaints. No fever/chills or specific RUQ pain, no change in vision or headache, denies new/worse swelling.  Pt was late to [facility], established at [facility] 11/25 (37w); pregnancy appears mostly uncomplicated, otherwise. Of note, pt has had a few ED visits and multiple MAU visits (especially in the last two weeks), mostly for complaints of pain. Of note, MAU note from 12/12 documents that pt has been seen by multiple different providers for narcotic Rx's. Per MD call to [facility], pt has filled several Rx's from ED physicians and dentistry for narcotics in the past few months. . Maternal Medical History:  Reason for admission: Reason for admission: contractions.  Contractions: Onset was 6-12 hours ago.   Frequency: regular.   Duration is approximately 30 seconds.   Perceived severity is strong.   Pt reports significant pain with contractions, especially in back and low abdomen  Fetal activity: Perceived fetal activity is normal.   Last perceived fetal movement was within the past hour.    Prenatal complications: no prenatal complications Late to care at 35w+  Prenatal Complications - Diabetes: none.    OB History    Grav Para Term Preterm Abortions TAB SAB Ect Mult Living   6 0 0 0 5 1 4 0 0 0      Past Medical History  Diagnosis Date  . No pertinent past medical history    Past Surgical History  Procedure Date  . No past surgeries   . Dilation and curettage of uterus     TAB    Family History: family history  is not on file. Social History:  reports that she has been smoking Cigarettes.  She has a .8 pack-year smoking history. She has never used smokeless tobacco. She reports that she uses illicit drugs (Marijuana). She reports that she does not drink alcohol.  ROS: See HPI  Dilation: 3 Effacement (%): 60 Station: -2 Exam by:: Morrison Old RN Blood pressure 119/78, pulse 68, temperature 97 F (36.1 C), temperature source Axillary, resp. rate 18, height 5\' 3"  (1.6 m), weight 76.204 kg (168 lb), last menstrual period 03/23/2011. Maternal Exam:  Uterine Assessment: Contraction strength is firm.  Contraction duration is 30 seconds. Contraction frequency is regular.   Abdomen: Patient reports generalized tenderness.  Fetal presentation: vertex  Introitus: Normal vulva. Normal vagina.  Ferning test: not done.  Nitrazine test: not done. Amniotic fluid character: not assessed.  Pelvis: adequate for delivery.   Cervix: Cervix evaluated by digital exam.     Fetal Exam Fetal Monitor Review: Mode: ultrasound.   Baseline rate: 130-140.  Variability: moderate (6-25 bpm).   Pattern: accelerations present and variable decelerations.    Fetal State Assessment: Category I - tracings are normal.     Physical Exam  Vitals reviewed. Constitutional: She is oriented to person, place, and time. She appears well-developed and well-nourished. Distressed: visibly uncomfortable with contractions, multiple requests for pain medication.  HENT:  Head: Normocephalic and atraumatic.  Eyes: Conjunctivae normal are normal. Pupils are equal, round,  and reactive to light.  Neck: Normal range of motion. Neck supple.  Cardiovascular: Normal rate, regular rhythm and normal heart sounds.   Respiratory: Effort normal and breath sounds normal. No respiratory distress. She has no wheezes.  GI: Soft. Bowel sounds are normal. She exhibits no distension. There is generalized tenderness (diffuse).       Gravid   Genitourinary: Vagina normal. Pelvic exam was performed with patient prone.       Dilation: 3 Effacement (%): 60 Cervical Position: Posterior Station: -2 Exam by:: Morrison Old RN  Musculoskeletal: Normal range of motion. She exhibits no edema.  Neurological: She is alert and oriented to person, place, and time.  Skin: Skin is warm and dry.  Psychiatric: She has a normal mood and affect. Her behavior is normal.    Prenatal labs: ABO, Rh: --/--/A POS (11/08 1145) Antibody:   Rubella: 52.5 (11/08 1145) RPR: NON REACTIVE (11/08 1145)  HBsAg: NEGATIVE (11/08 1145)  HIV:    GBS: Positive (11/08 0000)   Assessment/Plan: 21 y.o. G6P0050 at [redacted]w[redacted]d by LMP -SIUP in early labor; late to [facility] (35w+) but appears relatively uncomplicated -significant N/V in MAU, unable to keep down PO Phenergan -GBS positive -multiple MAU visits past two weeks  -many complaints of pain, given narcotics on several occasions  -"history of substance abuse" on problem list  -MAU note from 12/12 documents pt was seen at multiple providers for narcotic Rx in October  Plan  -admit to L&D  -routine L&D orders, antiemetics, plus PCN for GBS prophylaxis  -Fentanyl PRN for pain control, then epidural  -UA and UDS pending  -CSW consult for late to care plus ?substance abuse  Above discussed with Dr. Thad Ranger.  Bernice Mcauliffe 01/02/2012, 12:26 PM

## 2012-01-02 NOTE — Progress Notes (Signed)
  Patient ID: [name] [name], female   DOB: 29-Jun-1990, 21 y.o.   MRN: [id number]  S:  Called to room for FHR in 90-100s. Pt comfortable   O:   Filed Vitals:   01/02/12 2116 01/02/12 2120 01/02/12 2132 01/02/12 2202  BP: 92/58 109/59 112/55 132/85  Pulse: 58 57 62 82  Temp:  97.4 F (36.3 C)    TempSrc:  Axillary    Resp: 20 20 18 18   Height:      Weight:        Cervix:  7/100/-1 FHTs:  Baseline 120, marked variability, accels present, decel to 90-100 for 4 minutes, then back to 110s and down to 90s for 1 minutes, followed by several minutes of 120-150s.  At 21:07, variables to 90s and then 60s lasting 30-60 sec, for 4 minutes with some recovery in between. Recovery to 120 bmp with good variability at 21:14.  Pitocin stopped. Pt turned.  CTX:  MVUS 170-190s   A/P 21 y.o. G6P0050 at [redacted]w[redacted]d with SOL, augmented with pitocin. No change from previous exam. FHTs overall reassuring. Will restart pitocin in 30 minutes with reassuring strip.  Napoleon Form, MD

## 2012-01-02 NOTE — MAU Note (Signed)
Pt states contractions began around 0500 and are getting progressively stronger. States she has not felt pain like this before.

## 2012-01-02 NOTE — Progress Notes (Addendum)
Patient ID: [name] [name], female   DOB: Jun 11, 1990, 21 y.o.   MRN: [id number]   S: Pt blocked and comfortable  O:  BP 88/61  FHTs:  Baseline 120, accels present, mod variability, 90-110s with contractions - variables,earlies, some lates.  Prolonged contraction at 19:50 with FHR at 100s for 4 minutes, moderate variability throughout.  CTX:  q 1-5, MVUs:  180s  Cervix:  5/90/-1   A/P Ephedrine given - BP improved as well as FHR. Overall reassuring. Making some cervical change. Continue pitocin and monitor closely.  Napoleon Form, MD

## 2012-01-02 NOTE — Progress Notes (Signed)
[name] [name] is a 21 y.o. G6P0050 at [redacted]w[redacted]d admitted for active labor  Subjective: Called to room for [facility] in 100s. Pt comfortable.  Objective: BP 132/85  Pulse 82  Temp 97.4 F (36.3 C) (Axillary)  Resp 18  Ht 5\' 3"  (1.6 m)  Wt 76.204 kg (168 lb)  BMI 29.76 kg/m2  LMP 03/23/2011   Total I/O In: -  Out: 1000 [Urine:1000]  Filed Vitals:   01/02/12 2116 01/02/12 2120 01/02/12 2132 01/02/12 2202  BP: 92/58 109/59 112/55 132/85  Pulse: 58 57 62 82  Temp:  97.4 F (36.3 C)    TempSrc:  Axillary    Resp: 20 20 18 18   Height:      Weight:         FHT:  FHR: 120 baseline bpm, variability: moderate to marked,  accelerations:  Present,  decelerations:  Present HR in 100-110s for 4 minutes with contractions UC:   MVUs: 180-200  SVE:   Dilation: 7 Effacement (%): 90 Station: -1 Exam by:: Dr Thad Ranger  Labs: Lab Results  Component Value Date   WBC 13.1* 01/02/2012   HGB 9.7* 01/02/2012   HCT 30.9* 01/02/2012   MCV 77.1* 01/02/2012   PLT 270 01/02/2012    Assessment / Plan: SOL, augmented with pitocin, progressing well  Labor: Progressing on Pitocin, MVUs adequate Preeclampsia:  n/a Fetal Wellbeing:  Category II Pain Control:  Epidural I/D:  PCN Anticipated MOD:  NSVD  Discussed with Dr. Erin Fulling. Overall reassuring strip:  good variability, baseline has been 120s, FHRs is 110s-120s. Continue with pitocin. Monitor closely.  Napoleon Form 01/02/2012, 10:22 PM

## 2012-01-02 NOTE — Progress Notes (Signed)
  Patient ID: [name] [name], female   DOB: 06-13-1990, 21 y.o.   MRN: [id number]  S:  Pt comfortable with epidural  O:  VSS Cervix:  4/70/-2 FHTs:  120s, mod variability, accels present, early and variable decels CTX:  q 5 min  A/P Protracted latent phase Start pitocin FHTs cat II   Napoleon Form, MD

## 2012-01-02 NOTE — Progress Notes (Signed)
[name] [name] is a 21 y.o. G6P0050 at [redacted]w[redacted]d  Subjective: Very uncomfortable with contractions, epidural "not kicked in" yet, but pt states "I feel weird in my chest" and "I feel like I'm too high, I don't like this feeling." Denies chest pain or palpitations or significant SOB.  Objective: BP 111/86  Pulse 106  Temp 98.4 F (36.9 C) (Oral)  Resp 18  Ht 5\' 3"  (1.6 m)  Wt 76.204 kg (168 lb)  BMI 29.76 kg/m2  LMP 03/23/2011      FHT:  FHR: 130-140 bpm, variability: moderate,  accelerations:  Present,  decelerations:  Present rare variables UC:   irregular, every 3-7 minutes but poor pick-up on toco SVE:   Dilation: 4 Effacement (%): 70 Station: -2 Exam by:: Dr. Casper Harrison  Labs: Lab Results  Component Value Date   WBC 13.1* 01/02/2012   HGB 9.7* 01/02/2012   HCT 30.9* 01/02/2012   MCV 77.1* 01/02/2012   PLT 270 01/02/2012    Assessment / Plan: Spontaneous labor, progressing slowly -epidural placed, urinary foley placed -some subjective anxiety but lungs CTAB, heart rate wnl  Labor: Will monitor contractions now that tocometry can pick up better (pt better able to lie still/flat, after epidural), may start Pitocin to augment for better contraction pattern Preeclampsia:  no signs/symptoms Fetal Wellbeing:  Overall Category I Pain Control:  Epidural I/D:  PCN Anticipated MOD:  NSVD  Ailyn Gladd 01/02/2012, 2:26 PM

## 2012-01-02 NOTE — Progress Notes (Signed)
Patient ID: [name] [name], female   DOB: May 14, 1990, 21 y.o.   MRN: [id number]  S:  Pt comfortable.  O:   Filed Vitals:   01/02/12 2120 01/02/12 2132 01/02/12 2202 01/02/12 2232  BP: 109/59 112/55 132/85 135/85  Pulse: 57 62 82 73  Temp: 97.4 F (36.3 C)     TempSrc: Axillary     Resp: 20 18 18 18   Height:      Weight:        Cervix:  7/100/0.  FHTs:  135, moderate variability, accels present, no decels for past 30 minutes MVUs:  <100  A/P Restart pitocin and monitor closely. PCN for GBS prophylaxis  Hope for SVD  Napoleon Form, MD

## 2012-01-03 ENCOUNTER — Encounter (HOSPITAL_COMMUNITY): Payer: Self-pay | Admitting: Family Medicine

## 2012-01-03 DIAGNOSIS — O9989 Other specified diseases and conditions complicating pregnancy, childbirth and the puerperium: Secondary | ICD-10-CM

## 2012-01-03 DIAGNOSIS — Z22330 Carrier of Group B streptococcus: Secondary | ICD-10-CM

## 2012-01-03 LAB — CBC
MCHC: 32 g/dL (ref 30.0–36.0)
RDW: 13.6 % (ref 11.5–15.5)

## 2012-01-03 MED ORDER — IBUPROFEN 600 MG PO TABS
600.0000 mg | ORAL_TABLET | Freq: Four times a day (QID) | ORAL | Status: DC
  Administered 2012-01-03 – 2012-01-05 (×8): 600 mg via ORAL
  Filled 2012-01-03 (×8): qty 1

## 2012-01-03 MED ORDER — BENZOCAINE-MENTHOL 20-0.5 % EX AERO
1.0000 "application " | INHALATION_SPRAY | CUTANEOUS | Status: DC | PRN
  Administered 2012-01-03: 1 via TOPICAL
  Filled 2012-01-03: qty 56

## 2012-01-03 MED ORDER — DIBUCAINE 1 % RE OINT: 1.0000 "application " | TOPICAL_OINTMENT | RECTAL | Status: DC | PRN

## 2012-01-03 MED ORDER — LANOLIN HYDROUS EX OINT: TOPICAL_OINTMENT | CUTANEOUS | Status: DC | PRN

## 2012-01-03 MED ORDER — WITCH HAZEL-GLYCERIN EX PADS: 1.0000 "application " | MEDICATED_PAD | CUTANEOUS | Status: DC | PRN

## 2012-01-03 MED ORDER — DIPHENHYDRAMINE HCL 25 MG PO CAPS: 25.0000 mg | ORAL_CAPSULE | Freq: Four times a day (QID) | ORAL | Status: DC | PRN

## 2012-01-03 MED ORDER — TETANUS-DIPHTH-ACELL PERTUSSIS 5-2.5-18.5 LF-MCG/0.5 IM SUSP: 0.5000 mL | Freq: Once | INTRAMUSCULAR | Status: DC

## 2012-01-03 MED ORDER — PRENATAL MULTIVITAMIN CH
1.0000 | ORAL_TABLET | Freq: Every day | ORAL | Status: DC
  Administered 2012-01-03 – 2012-01-05 (×3): 1 via ORAL
  Filled 2012-01-03 (×3): qty 1

## 2012-01-03 MED ORDER — SIMETHICONE 80 MG PO CHEW: 80.0000 mg | CHEWABLE_TABLET | ORAL | Status: DC | PRN

## 2012-01-03 MED ORDER — OXYCODONE-ACETAMINOPHEN 5-325 MG PO TABS
1.0000 | ORAL_TABLET | ORAL | Status: DC | PRN
  Administered 2012-01-03 (×2): 1 via ORAL
  Administered 2012-01-03 – 2012-01-05 (×10): 2 via ORAL
  Filled 2012-01-03 (×12): qty 2

## 2012-01-03 MED ORDER — ONDANSETRON HCL 4 MG PO TABS: 4.0000 mg | ORAL_TABLET | ORAL | Status: DC | PRN

## 2012-01-03 MED ORDER — SENNOSIDES-DOCUSATE SODIUM 8.6-50 MG PO TABS
2.0000 | ORAL_TABLET | Freq: Every day | ORAL | Status: DC
  Administered 2012-01-03 – 2012-01-04 (×2): 2 via ORAL

## 2012-01-03 MED ORDER — ONDANSETRON HCL 4 MG/2ML IJ SOLN: 4.0000 mg | INTRAMUSCULAR | Status: DC | PRN

## 2012-01-03 MED ORDER — ZOLPIDEM TARTRATE 5 MG PO TABS: 5.0000 mg | ORAL_TABLET | Freq: Every evening | ORAL | Status: DC | PRN

## 2012-01-03 NOTE — Anesthesia Postprocedure Evaluation (Signed)
  Anesthesia Post-op Note  Patient: Neurosurgeon  Procedure(s) Performed: * No procedures listed *  Patient Location: Women's Unit  Anesthesia Type:Epidural  Level of Consciousness: awake  Airway and Oxygen Therapy: Patient Spontanous Breathing  Post-op Pain: none  Post-op Assessment: Patient's Cardiovascular Status Stable, Respiratory Function Stable, Patent Airway, No signs of Nausea or vomiting, Adequate PO intake, Pain level controlled, No headache, No backache, No residual numbness and No residual motor weakness  Post-op Vital Signs: Reviewed and stable  Complications: No apparent anesthesia complications

## 2012-01-03 NOTE — Progress Notes (Signed)
UR chart review completed.  

## 2012-01-04 MED ORDER — TETANUS-DIPHTH-ACELL PERTUSSIS 5-2.5-18.5 LF-MCG/0.5 IM SUSP
0.5000 mL | Freq: Once | INTRAMUSCULAR | Status: AC
  Administered 2012-01-04: 0.5 mL via INTRAMUSCULAR
  Filled 2012-01-04: qty 0.5

## 2012-01-04 NOTE — Progress Notes (Signed)
Clinical Social Work Department PSYCHOSOCIAL ASSESSMENT - MATERNAL/CHILD 01/04/2012  Patient:  [name] [name], [name] [name]  Account Number:  [id number]  Admit Date:  01/02/2012  Marjo Bicker Name:   "DAVIN"    Clinical Social Worker:  Reece Levy, Theresia Majors   Date/Time:  01/04/2012 11:28 AM  Date Referred:  01/04/2012   Referral source  Physician     Referred reason  [facility]  Substance Abuse   Other referral source:    I:  FAMILY / HOME ENVIRONMENT Child's legal guardian:  PARENT  Guardian - Name Guardian - Age Guardian - Address  Celine Dishman [address] 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    III  STRENGTHS Strengths  Adequate Resources  Home prepared for Child (including basic supplies)  Supportive family/friends   Strength comment:  MOB very engaged in caring for baby- has good support from her mother and grandmother (financial, emotional, etc)   IV  RISK FACTORS AND CURRENT PROBLEMS Current Problem:  YES   Risk Factor & Current Problem Patient Issue Family Issue Risk Factor / Current Problem Comment  Substance Abuse Y Y MOB positive for THC and benzo's   N N     V  SOCIAL WORK ASSESSMENT MOB made aware that due to her history and the labs indicating THC use, we were required to test the baby for drugs-  she became quite concerned and upset that the baby might be taken from her- she denies cocaine use since she found out she was pregnant (@25wks ) and states she had LPNC due to Medicaid inactive- states she used the ER for check ups-      VI SOCIAL WORK PLAN Social Work Plan  Other   Type of pt/family education:   DSS  MH f/u (supportive counseling)  CPS if baby drug screen is positive   If child protective services report - county:  [place] If child protective services report - date:  01/04/2012 Information/referral to community resources comment:   MH  DSS   Other social work plan:   CSW to check baby drug screen and proceed as appropriate-     Reece Levy, MSW, [employer] [phone]

## 2012-01-04 NOTE — Progress Notes (Deleted)
Clinical Social Work Department PSYCHOSOCIAL ASSESSMENT - MATERNAL/CHILD 01/04/2012  Patient:  [name],[name]  Account Number:  [id number]  Admit Date:  01/02/2012  Childs Name:   [name] Ray Parker, Jr. "CJ"    Clinical Social Worker:  Pankaj Haack, LCSWA   Date/Time:  01/04/2012 10:54 AM  Date Referred:  01/04/2012   Referral source  Physician     Referred reason  Psychosocial assessment  Substance Abuse   Other referral source:    I:  FAMILY / HOME ENVIRONMENT Child's legal guardian:  PARENT  Guardian - Name Guardian - Age Guardian - Address  [name] [name] [address] [place], [place] [address]   Other household support members/support persons Name Relationship DOB  Diane Gibson FRIEND 54yo   Other support:   uncertain    II  PSYCHOSOCIAL DATA Information Source:  Patient Interview  Financial and Community Resources Employment:   Never worked   Financial resources:  Medicaid If Medicaid - County:  [place]  School / Grade:  7 Maternity Care Coordinator / Child Services Coordination / Early Interventions:   WIC  [employer]  Cultural issues impacting care:   recent homelessness    III  STRENGTHS Strengths  Supportive family/friends   Strength comment:  Patient voices a strong interest and desire to keep the baby- she is hopeful that the child can go home with her but understands this will be CPS's decision.   IV  RISK FACTORS AND CURRENT PROBLEMS Current Problem:  YES   Risk Factor & Current Problem Patient Issue Family Issue Risk Factor / Current Problem Comment  Basic Needs (food,housing,etc) Y Y has been homeless in recent past  DSS Involvement Y Y CPS referral made/ hx CPS with 1st born in 2004  Mental Illness Y Y MOB with hx Bipolar d/o and PTSD  Substance Abuse Y Y cocaine use by MOB @ 1 wk ago    V  SOCIAL WORK ASSESSMENT MOB found holding baby upon visit- she tells me she has been living with her friend Diane for about 1.5 months-  prior to that she was homeless- The FOB was recently released from jail- he has been staying with her at Diane's and is looking for work- The MOB states her sister has custody og her 8yo son due to CPS involvment- they reside in [place] and she sees him regularly per her report.  MOB admits to drug use and is interested in drug treatment- she has completed application for [facility]-      VI SOCIAL WORK PLAN Social Work Plan  Other  Child Protective Services Report   Type of pt/family education:   If child protective services report - county:  [place] If child protective services report - date:  01/04/2012 Information/referral to community resources comment:   [facility]   Other social work plan:   CPS referral made and we are awaiting their visit and determination-  Updated RN and MD as well as patient of this-    CPS to visit patient and update this CSW- will advise.   Quintyn Dombek, MSW, LCSWA [phone]  

## 2012-01-04 NOTE — Progress Notes (Signed)
Post Partum Day #1 Subjective: up ad lib, voiding and states is having some pain; bottlefeeding; has not had social work consult yet  Objective: Blood pressure 124/77, pulse 69, temperature 97.8 F (36.6 C), temperature source Oral, resp. rate 18, height 5\' 3"  (1.6 m), weight 76.204 kg (168 lb), last menstrual period 03/23/2011, SpO2 98.00%, unknown if currently breastfeeding.  Physical Exam:  General: alert, cooperative and mild distress Lochia: appropriate Uterine Fundus: firm DVT Evaluation: No evidence of DVT seen on physical exam.   Basename 01/03/12 0550 01/02/12 1155  HGB 8.5* 9.7*  HCT 26.6* 30.9*    Assessment/Plan: Plan for discharge tomorrow and Social Work consult   LOS: 2 days   Cam Hai 01/04/2012, 7:24 AM

## 2012-01-05 ENCOUNTER — Encounter: Payer: Self-pay | Admitting: Obstetrics and Gynecology

## 2012-01-05 MED ORDER — IBUPROFEN 600 MG PO TABS: 600.0000 mg | ORAL_TABLET | Freq: Four times a day (QID) | ORAL | Status: DC

## 2012-01-05 NOTE — Discharge Summary (Addendum)
Obstetric Discharge Summary Reason for Admission: onset of labor Prenatal Procedures: none Intrapartum Procedures: spontaneous vaginal delivery Postpartum Procedures: none Complications-Operative and Postpartum: none Hemoglobin  Date Value Range Status  01/03/2012 8.5* 12.0 - 15.0 g/dL Final     HCT  Date Value Range Status  01/03/2012 26.6* 36.0 - 46.0 % Final    Physical Exam:  General: alert, cooperative and no distress Lochia: appropriate Uterine Fundus: firm DVT Evaluation: No evidence of DVT seen on physical exam.  Discharge Diagnoses: Term Pregnancy-delivered  Discharge Information: Date: 01/05/2012 Activity: pelvic rest Diet: routine Medications: Ibuprofen Condition: stable Instructions: refer to practice specific booklet Discharge to: home   Newborn Data: Live born female  Birth Weight: 6 lb 8.2 oz (2955 g) APGAR: 8, 9  Home with DSS involved, they didn't decided if baby can go home with mother.. Bottle feeding and breastfeeding. Pt didn't want birth control, she states she will be abstinent She desires circumcision outside of hospital  Governor Specking 01/05/2012, 6:31 AM

## 2012-01-05 NOTE — Discharge Summary (Signed)
Seen and agree with note Zailey Audia CNM 

## 2012-01-05 NOTE — Clinical Social Work Note (Signed)
CSW revisited concerns with MOB due to h/o pain med use during pregnancy and h/o THC use.  MOB very appropriate and calm, able to soothe baby. She reports her mother will be a great support to her. She is aware UDS on baby is negative and that we will follow up on the MDS. CSW shared findings and plan with Dr. Sherral Hammers.   CSW to check MDS in the next week and will make referrals as needed.   Doreen Salvage, LCSW  Coverning NICU for [facility] M-F 8am-12pm

## 2012-01-07 ENCOUNTER — Telehealth (HOSPITAL_COMMUNITY): Payer: Self-pay | Admitting: *Deleted

## 2012-01-07 ENCOUNTER — Emergency Department (HOSPITAL_COMMUNITY)
Admission: EM | Admit: 2012-01-07 | Discharge: 2012-01-07 | Disposition: A | Discharge status: Home / Self Care | Payer: Medicaid Other | Attending: Emergency Medicine | Admitting: Emergency Medicine

## 2012-01-07 ENCOUNTER — Encounter (HOSPITAL_COMMUNITY): Payer: Self-pay | Admitting: Emergency Medicine

## 2012-01-07 DIAGNOSIS — O9279 Other disorders of lactation: Secondary | ICD-10-CM | POA: Insufficient documentation

## 2012-01-07 DIAGNOSIS — O99335 Smoking (tobacco) complicating the puerperium: Secondary | ICD-10-CM | POA: Insufficient documentation

## 2012-01-07 DIAGNOSIS — N6459 Other signs and symptoms in breast: Secondary | ICD-10-CM

## 2012-01-07 MED ORDER — HYDROCODONE-ACETAMINOPHEN 5-500 MG PO TABS
1.0000 | ORAL_TABLET | Freq: Four times a day (QID) | ORAL | Status: DC | PRN
Start: 2012-01-07 — End: 2012-02-05

## 2012-01-07 NOTE — ED Notes (Signed)
Pt c/o of bilateral breast pain 10/10. States that she had a baby x4 ago. Pt is not breast feeding.

## 2012-01-07 NOTE — ED Provider Notes (Signed)
History     CSN: [id number]  Arrival date & time 01/07/12  1405   First MD Initiated Contact with Patient 01/07/12 1447      Chief Complaint  Patient presents with  . Breast Pain    (Consider location/radiation/quality/duration/timing/severity/associated sxs/prior treatment) HPI [name] [name] is a 21 y.o. female who presents to ED with complaint of bilateral breast pain. Pt states pain started 2 days ago. Pt states she is 4 days post partum, vaginal delivery. States no issues with delivery or post partum problems. Pt states she is not breast feeding. States called her lactation specialist who told her to pump. Pt states she got frustrated with her and hung up the phone and decided to come here. States pain worsening. Pt states pumping is painful and she is unable to do it due to pain. No redness to the breast. No fever.  Past Medical History  Diagnosis Date  . No pertinent past medical history     Past Surgical History  Procedure Date  . No past surgeries   . Dilation and curettage of uterus     TAB     Family History  Problem Relation Age of Onset  . Other Neg Hx     History  Substance Use Topics  . Smoking status: Current Every Day Smoker -- 0.2 packs/day for 8 years    Types: Cigarettes  . Smokeless tobacco: Never Used  . Alcohol Use: No     Comment: occ    OB History    Grav Para Term Preterm Abortions TAB SAB Ect Mult Living   6 1 1  0 5 1 4  0 0 1      Review of Systems  Constitutional: Negative for fever and chills.  Respiratory: Negative.   Cardiovascular:       Positive for breast pain  Neurological: Negative for weakness and numbness.    Allergies  Coconut fatty acids  Home Medications   Current Outpatient Rx  Name  Route  Sig  Dispense  Refill  . IBUPROFEN 200 MG PO TABS   Oral   Take 400 mg by mouth every 8 (eight) hours as needed. For pain.         Marland Kitchen PRENATAL MULTIVITAMIN CH   Oral   Take 1 tablet by mouth daily.         Marland Kitchen  HYDROCODONE-ACETAMINOPHEN 5-500 MG PO TABS   Oral   Take 1-2 tablets by mouth every 6 (six) hours as needed for pain.   15 tablet   0     BP 112/79  Pulse 79  SpO2 98%  LMP 03/23/2011  Breastfeeding? No  Physical Exam  Nursing note and vitals reviewed. Constitutional: She is oriented to person, place, and time. She appears well-developed and well-nourished. No distress.  Eyes: Conjunctivae normal are normal.  Neck: Neck supple.  Cardiovascular: Normal rate, regular rhythm and normal heart sounds.   Pulmonary/Chest: Effort normal and breath sounds normal. No respiratory distress. She has no wheezes. She has no rales.       bilateral breast engorgement. Breasts are symmetrically swollen, tender all over. No erythemal. No nipple discharge  Musculoskeletal: She exhibits no edema.  Neurological: She is alert and oriented to person, place, and time.  Skin: Skin is warm and dry.    ED Course  Procedures (including critical care time)  Labs Reviewed - No data to display No results found.   1. Breast engorgement       MDM  Pt is 4 days post partum, exam consistent with bilateral breast engorgement. No signs of breast infection. Rest of the exam normal. Pt wanting something for pain. Will treat with vicodin, encouraged warm compresses, cabbage leaves, pumping to get some pressure off. Follow up with her lactation specialist.  Filed Vitals:   01/07/12 1445  BP: 112/79  Pulse: 79           Tamar Lipscomb A Marlaysia Lenig, PA 01/07/12 1544

## 2012-01-07 NOTE — ED Provider Notes (Signed)
Medical screening examination/treatment/procedure(s) were performed by non-physician practitioner and as supervising physician I was immediately available for consultation/collaboration.    Tonnie Friedel R Lourene Hoston, MD 01/07/12 1619 

## 2012-01-26 NOTE — Discharge Summary (Signed)

## 2012-02-05 ENCOUNTER — Emergency Department (HOSPITAL_COMMUNITY)
Admission: EM | Admit: 2012-02-05 | Discharge: 2012-02-06 | Disposition: A | Discharge status: Home / Self Care | Payer: Medicaid Other | Attending: Emergency Medicine | Admitting: Emergency Medicine

## 2012-02-05 ENCOUNTER — Encounter (HOSPITAL_COMMUNITY): Payer: Self-pay | Admitting: *Deleted

## 2012-02-05 DIAGNOSIS — N939 Abnormal uterine and vaginal bleeding, unspecified: Secondary | ICD-10-CM | POA: Insufficient documentation

## 2012-02-05 DIAGNOSIS — O99335 Smoking (tobacco) complicating the puerperium: Secondary | ICD-10-CM | POA: Insufficient documentation

## 2012-02-05 DIAGNOSIS — N926 Irregular menstruation, unspecified: Secondary | ICD-10-CM | POA: Insufficient documentation

## 2012-02-05 DIAGNOSIS — O26899 Other specified pregnancy related conditions, unspecified trimester: Secondary | ICD-10-CM | POA: Insufficient documentation

## 2012-02-05 DIAGNOSIS — N888 Other specified noninflammatory disorders of cervix uteri: Secondary | ICD-10-CM

## 2012-02-05 DIAGNOSIS — R109 Unspecified abdominal pain: Secondary | ICD-10-CM | POA: Insufficient documentation

## 2012-02-05 DIAGNOSIS — N949 Unspecified condition associated with female genital organs and menstrual cycle: Secondary | ICD-10-CM | POA: Insufficient documentation

## 2012-02-05 DIAGNOSIS — R112 Nausea with vomiting, unspecified: Secondary | ICD-10-CM | POA: Insufficient documentation

## 2012-02-05 DIAGNOSIS — Z3202 Encounter for pregnancy test, result negative: Secondary | ICD-10-CM | POA: Insufficient documentation

## 2012-02-05 DIAGNOSIS — O2390 Unspecified genitourinary tract infection in pregnancy, unspecified trimester: Secondary | ICD-10-CM | POA: Insufficient documentation

## 2012-02-05 DIAGNOSIS — N72 Inflammatory disease of cervix uteri: Secondary | ICD-10-CM | POA: Insufficient documentation

## 2012-02-05 DIAGNOSIS — N898 Other specified noninflammatory disorders of vagina: Secondary | ICD-10-CM | POA: Insufficient documentation

## 2012-02-05 DIAGNOSIS — Z9889 Other specified postprocedural states: Secondary | ICD-10-CM | POA: Insufficient documentation

## 2012-02-05 LAB — POCT I-STAT, CHEM 8
Glucose, Bld: 75 mg/dL (ref 70–99)
HCT: 37 % (ref 36.0–46.0)
Hemoglobin: 12.6 g/dL (ref 12.0–15.0)
Potassium: 3.9 mEq/L (ref 3.5–5.1)
Sodium: 142 mEq/L (ref 135–145)

## 2012-02-05 NOTE — ED Notes (Signed)
Pt states post-partum x 1 month. Pt states bleeding stopped  X 4 days then started vaginal bleeding last night, saturating more than 2 maxi pads per hour since bleeding resumed. Pt states abdominal cramping and passing golf-ball sized blood clots since bleeding resumed. Pt denies sexual activity prior to the bleeding starting.

## 2012-02-06 ENCOUNTER — Emergency Department (HOSPITAL_COMMUNITY): Payer: Medicaid Other

## 2012-02-06 MED ORDER — MORPHINE SULFATE 4 MG/ML IJ SOLN
4.0000 mg | Freq: Once | INTRAMUSCULAR | Status: AC
  Administered 2012-02-06: 4 mg via INTRAMUSCULAR
  Filled 2012-02-06: qty 1

## 2012-02-06 MED ORDER — ONDANSETRON 4 MG PO TBDP
4.0000 mg | ORAL_TABLET | Freq: Once | ORAL | Status: AC
  Administered 2012-02-06: 4 mg via ORAL
  Filled 2012-02-06: qty 1

## 2012-02-06 MED ORDER — OXYCODONE-ACETAMINOPHEN 5-325 MG PO TABS: 1.0000 | ORAL_TABLET | Freq: Four times a day (QID) | ORAL | Status: DC | PRN

## 2012-02-06 MED ORDER — TRAMADOL HCL 50 MG PO TABS: 50.0000 mg | ORAL_TABLET | Freq: Four times a day (QID) | ORAL | Status: DC | PRN

## 2012-02-06 NOTE — ED Notes (Signed)
Patient notified for need of urine to perform testing

## 2012-02-06 NOTE — ED Provider Notes (Signed)
History     CSN: [id number]  Arrival date & time 02/05/12  2023   First MD Initiated Contact with Patient 02/05/12 2311      Chief Complaint  Patient presents with  . Vaginal Bleeding  . Postpartum Complications    (Consider location/radiation/quality/duration/timing/severity/associated sxs/prior treatment) HPI Comments: [name] [name] is a G1P1 - one month post partum.  She reports having an uncomplicated pregnancy and regular vaginal delivery.  She had no issues during the hospitalization and is yet to have her 6 wk follow-up gyn office visit.  She has had spotting since leaving the hospital but it stopped about 4 days ago.  Yesterday, she experienced abdominal and pelvic cramping, nausea, and significant vaginal bleeding.  She reports having passed large amounts of blood and clots up to the size of a golf ball.  She denies fever, chills, vaginal pain, abnormal discharges, and dysuria.  She denies any menstrual problems prior to the pregnancy.  She is not currently on any birth control medication.  Patient is a 22 y.o. female presenting with vaginal bleeding. The history is provided by the patient. No language interpreter was used.  Vaginal Bleeding This is a new problem. The current episode started yesterday. The problem occurs constantly. The problem has not changed since onset.Associated symptoms include abdominal pain. Pertinent negatives include no chest pain, no headaches and no shortness of breath. She has tried nothing for the symptoms.    Past Medical History  Diagnosis Date  . No pertinent past medical history     Past Surgical History  Procedure Date  . No past surgeries   . Dilation and curettage of uterus     TAB     Family History  Problem Relation Age of Onset  . Other Neg Hx     History  Substance Use Topics  . Smoking status: Current Every Day Smoker -- 0.2 packs/day for 8 years    Types: Cigarettes  . Smokeless tobacco: Never Used  . Alcohol Use: No   Comment: occ    OB History    Grav Para Term Preterm Abortions TAB SAB Ect Mult Living   6 1 1  0 5 1 4  0 0 1      Review of Systems  Constitutional: Negative for fever, chills, diaphoresis, activity change, appetite change and fatigue.  HENT: Negative for nosebleeds, congestion, sore throat and rhinorrhea.   Respiratory: Negative for cough, chest tightness and shortness of breath.   Cardiovascular: Negative for chest pain, palpitations and leg swelling.  Gastrointestinal: Positive for nausea, vomiting and abdominal pain. Negative for diarrhea and constipation.  Genitourinary: Positive for vaginal bleeding, menstrual problem and pelvic pain. Negative for dysuria, urgency, flank pain, vaginal discharge, difficulty urinating and vaginal pain.  Neurological: Negative for dizziness, syncope, weakness, light-headedness and headaches.  Hematological: Does not bruise/bleed easily.    Allergies  Coconut fatty acids and Vicodin  Home Medications  No current outpatient prescriptions on file.  BP 109/89  Pulse 90  Temp 98.5 F (36.9 C) (Oral)  Resp 16  Ht 5\' 3"  (1.6 m)  Wt 146 lb (66.225 kg)  BMI 25.86 kg/m2  SpO2 100%  Breastfeeding? No  Physical Exam  Nursing note and vitals reviewed. Constitutional: She is oriented to person, place, and time. She appears well-developed and well-nourished. No distress.  HENT:  Head: Normocephalic and atraumatic.  Right Ear: External ear normal.  Left Ear: External ear normal.  Nose: Nose normal.  Mouth/Throat: Oropharynx is clear and moist. No  oropharyngeal exudate.  Eyes: Conjunctivae normal are normal. Pupils are equal, round, and reactive to light. Right eye exhibits no discharge. Left eye exhibits no discharge. No scleral icterus.  Neck: Normal range of motion. Neck supple. No JVD present. No tracheal deviation present.  Cardiovascular: Normal rate, regular rhythm, normal heart sounds and intact distal pulses.  Exam reveals no gallop and no  friction rub.   No murmur heard. Pulmonary/Chest: Effort normal and breath sounds normal. No stridor. No respiratory distress. She has no wheezes. She has no rales. She exhibits no tenderness.  Abdominal: Soft. Bowel sounds are normal. She exhibits no distension and no mass. There is no tenderness. There is no rebound and no guarding. Hernia confirmed negative in the right inguinal area and confirmed negative in the left inguinal area.  Genitourinary: Uterus normal. No labial fusion. There is no rash, tenderness, lesion or injury on the right labia. There is no rash, tenderness, lesion or injury on the left labia. Uterus is not deviated, not enlarged, not fixed and not tender. Cervix exhibits no motion tenderness, no discharge and no friability. Right adnexum displays no mass, no tenderness and no fullness. Left adnexum displays no mass, no tenderness and no fullness. There is bleeding around the vagina. No erythema or tenderness around the vagina. No foreign body around the vagina. No signs of injury around the vagina. Vaginal discharge (scant) found.       Note enlarged cervix.  No CMT.    Musculoskeletal: Normal range of motion. She exhibits no edema and no tenderness.  Lymphadenopathy:    She has no cervical adenopathy.       Right: No inguinal adenopathy present.       Left: No inguinal adenopathy present.  Neurological: She is alert and oriented to person, place, and time.  Skin: Skin is warm and dry. No rash noted. She is not diaphoretic. No erythema. No pallor.  Psychiatric: She has a normal mood and affect. Her behavior is normal.    ED Course  Procedures (including critical care time)   Labs Reviewed  POCT I-STAT, CHEM 8  PREGNANCY, URINE   No results found.   No diagnosis found.    MDM  Pt presents for evaluation of vaginal bleeding. She is 1 month postpartum.  Note nl VS, pt appears nontoxic, NAD.  She has no significant abdominal tenderness on exam.  She has a nl  metabolic panel and is not anemic.  0640.  Pt stable, NAD.  Note stable VS.  Pt has not experienced large amts of bleeding since arrival.  The u/s demonstrates prominent nabothian cysts.  Will have her follow-up closely with her Ob/Gyn provider.      Tobin Chad, MD 02/06/12 [phone]

## 2012-02-06 NOTE — ED Notes (Signed)
Patient is alert and oriented x3.  She was given DC instructions and follow up visit instructions.  Patient gave verbal understanding. She was DC ambulatory under her own power to home.  V/S stable.  sHe was not showing any signs of distress on DC 

## 2012-02-06 NOTE — ED Notes (Signed)
Patient given urine cup. Patient is aware of urine sample needed. Patient is unable to urinate at this time.

## 2012-02-08 ENCOUNTER — Ambulatory Visit: Payer: Self-pay | Admitting: Obstetrics and Gynecology

## 2012-02-09 ENCOUNTER — Encounter: Payer: Self-pay | Admitting: Obstetrics & Gynecology

## 2012-02-09 ENCOUNTER — Ambulatory Visit (INDEPENDENT_AMBULATORY_CARE_PROVIDER_SITE_OTHER): Payer: Medicaid Other | Admitting: Obstetrics & Gynecology

## 2012-02-09 VITALS — BP 136/88 | HR 73 | Temp 97.9°F | Ht 63.0 in | Wt 159.3 lb

## 2012-02-09 DIAGNOSIS — IMO0001 Reserved for inherently not codable concepts without codable children: Secondary | ICD-10-CM

## 2012-02-09 DIAGNOSIS — Z309 Encounter for contraceptive management, unspecified: Secondary | ICD-10-CM

## 2012-02-09 DIAGNOSIS — N72 Inflammatory disease of cervix uteri: Secondary | ICD-10-CM

## 2012-02-09 DIAGNOSIS — N888 Other specified noninflammatory disorders of cervix uteri: Secondary | ICD-10-CM

## 2012-02-09 MED ORDER — MEDROXYPROGESTERONE ACETATE 150 MG/ML IM SUSP
150.0000 mg | Freq: Once | INTRAMUSCULAR | Status: AC
  Administered 2012-02-09: 150 mg via INTRAMUSCULAR

## 2012-02-09 MED ORDER — DOXYCYCLINE HYCLATE 100 MG PO CAPS: 100.0000 mg | ORAL_CAPSULE | Freq: Two times a day (BID) | ORAL | Status: DC

## 2012-02-10 ENCOUNTER — Encounter: Payer: Self-pay | Admitting: Obstetrics & Gynecology

## 2012-02-10 NOTE — Progress Notes (Signed)
Patient ID: [name], female   DOB: [birth year], 22 y.o.   MRN: [id number] Subjective:     [name] [name] is a 22 y.o. female who presents for a postpartum visit. She is 4 weeks postpartum following a spontaneous vaginal delivery. I have fully reviewed the prenatal and intrapartum course. The delivery was at term gestational weeks. Outcome: spontaneous vaginal delivery. Postpartum course has been uncomplicated;. Baby's course has been uncomplicated per mother. Bleeding no bleeding. Bowel function is normal. Bladder function is normal. Patient is not sexually active. Contraception method is Depo-Provera injections. Postpartum depression screening: but,pt declines assistance.  The following portions of the patient's history were reviewed and updated as appropriate: allergies, current medications, past family history, past medical history, past social history, past surgical history and problem list.  Review of Systems Behavioral/Psych: positive for depression   Objective:    BP 136/88  Pulse 73  Temp 97.9 F (36.6 C) (Oral)  Ht 5\' 3"  (1.6 m)  Wt 159 lb 4.8 oz (72.258 kg)  BMI 28.22 kg/m2  Breastfeeding? No  General:  alert   Breasts:  not done  Lungs: clear to auscultation bilaterally  Heart:  regular rate and rhythm, S1, S2 normal, no murmur, click, rub or gallop  Abdomen: soft, non-tender; bowel sounds normal; no masses,  no organomegaly   Vulva:  normal  Vagina: normal vagina  Cervix:  large 4 cm Nabothian cyst- drained in ofc.  No bleeding noted  Corpus: normal size, contour, position, consistency, mobility, non-tender  Adnexa:  no mass, fullness, tenderness  Rectal Exam: Not performed.        Assessment:    6 postpartum exam. Pap smear not done at today's visit. \ Large symptomatic Nabothian cyst (~4cm) drained in ofc Contraception conuseling  Plan:    1. Contraception: Depo-Provera injections 2. Nabothian cysts- drained in ofc  3. Follow up in: 3 months or as needed.    4. Positive post partum depression screen- patient refused social work consult or assistance

## 2012-02-10 NOTE — Patient Instructions (Addendum)

## 2012-03-04 ENCOUNTER — Emergency Department (HOSPITAL_COMMUNITY)
Admission: EM | Admit: 2012-03-04 | Discharge: 2012-03-04 | Discharge status: Against Medical Advice | Payer: Medicaid Other

## 2012-03-29 ENCOUNTER — Ambulatory Visit: Payer: Medicaid Other | Admitting: Advanced Practice Midwife

## 2012-09-03 ENCOUNTER — Encounter (HOSPITAL_COMMUNITY): Payer: Self-pay | Admitting: Emergency Medicine

## 2012-09-03 ENCOUNTER — Emergency Department (HOSPITAL_COMMUNITY)
Admission: EM | Admit: 2012-09-03 | Discharge: 2012-09-03 | Disposition: A | Discharge status: Home / Self Care | Payer: Self-pay | Attending: Emergency Medicine | Admitting: Emergency Medicine

## 2012-09-03 DIAGNOSIS — K047 Periapical abscess without sinus: Secondary | ICD-10-CM | POA: Insufficient documentation

## 2012-09-03 DIAGNOSIS — F17200 Nicotine dependence, unspecified, uncomplicated: Secondary | ICD-10-CM | POA: Insufficient documentation

## 2012-09-03 DIAGNOSIS — K0889 Other specified disorders of teeth and supporting structures: Secondary | ICD-10-CM

## 2012-09-03 MED ORDER — TRAMADOL HCL 50 MG PO TABS: 50.0000 mg | ORAL_TABLET | Freq: Four times a day (QID) | ORAL | Status: DC | PRN

## 2012-09-03 MED ORDER — PENICILLIN V POTASSIUM 500 MG PO TABS: 500.0000 mg | ORAL_TABLET | Freq: Four times a day (QID) | ORAL | Status: AC

## 2012-09-03 NOTE — ED Notes (Signed)
PA at bedside.

## 2012-09-03 NOTE — ED Notes (Signed)
Pt request for PA " to numb the other side, it just numb my jaw and not the tooth". P.A made aware and to follow u.

## 2012-09-03 NOTE — ED Notes (Signed)
Pt denies any further questions upon discharge.

## 2012-09-03 NOTE — ED Notes (Signed)
C/o toothache x several weeks that has been worse since Saturday.

## 2012-09-03 NOTE — ED Provider Notes (Signed)
Medical screening examination/treatment/procedure(s) were performed by non-physician practitioner and as supervising physician I was immediately available for consultation/collaboration.  Olivia Mackie, MD 09/03/12 [phone]

## 2012-09-03 NOTE — ED Provider Notes (Signed)
CSN: [id number]     Arrival date & time 09/03/12  0120 History     First MD Initiated Contact with Patient 09/03/12 0133     Chief Complaint  Patient presents with  . Dental Pain   HPI  History provided by the patient. Patient is a 22 year old female with no significant PMH who presents with worsened left upper molar pain. Patient states she has had worsening pain for the past 2 days. She has had some irritations to her tooth in the past and states it has been broken for quite some time. Pain however is much worse and she has been unable to sleep. She has been taking I Profen and Orajel at home without any improvements. She also reports one episode of nausea and vomiting. She denies any fevers. No other aggravating or alleviating factors. No other associated symptoms.     Past Medical History  Diagnosis Date  . No pertinent past medical history    Past Surgical History  Procedure Laterality Date  . No past surgeries    . Dilation and curettage of uterus      TAB    Family History  Problem Relation Age of Onset  . Other Neg Hx    History  Substance Use Topics  . Smoking status: Current Every Day Smoker -- 0.25 packs/day for 8 years    Types: Cigarettes  . Smokeless tobacco: Never Used  . Alcohol Use: Yes     Comment: occ   OB History   Grav Para Term Preterm Abortions TAB SAB Ect Mult Living   6 1 1  0 5 1 4  0 0 1     Review of Systems  Constitutional: Negative for fever.  HENT: Positive for dental problem.   Gastrointestinal: Positive for nausea and vomiting.  All other systems reviewed and are negative.    Allergies  Coconut fatty acids and Vicodin  Home Medications   Current Outpatient Rx  Name  Route  Sig  Dispense  Refill  . diphenhydrAMINE (BENADRYL) 25 MG tablet   Oral   Take 25 mg by mouth every 6 (six) hours as needed for itching or allergies.         Marland Kitchen ibuprofen (ADVIL,MOTRIN) 200 MG tablet   Oral   Take 400 mg by mouth every 6 (six) hours as  needed for pain.          BP 149/115  Pulse 79  Temp(Src) 98.2 F (36.8 C) (Oral)  Resp 20  SpO2 100%  LMP 08/27/2012 Physical Exam  Nursing note and vitals reviewed. Constitutional: She is oriented to person, place, and time. She appears well-developed and well-nourished. No distress.  HENT:  Head: Normocephalic.  Partial fracture to the posterior aspect of the left upper third molar. There is tenderness to percussion. No significant swelling of the gums.  Neck: Normal range of motion. Neck supple.  Cardiovascular: Normal rate and regular rhythm.   Pulmonary/Chest: Effort normal and breath sounds normal. No stridor. No respiratory distress. She has no wheezes.  Musculoskeletal: Normal range of motion.  Lymphadenopathy:    She has no cervical adenopathy.  Neurological: She is alert and oriented to person, place, and time.  Skin: Skin is warm and dry. No rash noted.  Psychiatric: She has a normal mood and affect. Her behavior is normal.    ED Course   Procedures   Dental Block Performed by: Angus Seller Authorized by: Angus Seller Consent: Verbal consent obtained. Risks and benefits: risks, benefits  and alternatives were discussed Consent given by: patient Patient identity confirmed: provided demographic data  Location: Left upper third molar  Local anesthetic: Bupivacaine 0.5% with epinephrine  Anesthetic total: 1.8 ml  Irrigation method: syringe  Patient tolerance: Patient tolerated the procedure well with no immediate complications. Pain improved.   1. Pain, dental   2. Periapical abscess     MDM  2:00 AM patient seen and evaluated. Patient appears well in no acute distress.  Patient improved after dental block. Referral provided with prescriptions.  Angus Seller, PA-C 09/03/12 [phone]

## 2012-10-01 ENCOUNTER — Ambulatory Visit: Payer: Medicaid Other | Admitting: Obstetrics & Gynecology

## 2012-10-14 ENCOUNTER — Encounter (HOSPITAL_COMMUNITY): Payer: Self-pay | Admitting: Nurse Practitioner

## 2012-10-14 ENCOUNTER — Emergency Department (HOSPITAL_COMMUNITY)
Admission: EM | Admit: 2012-10-14 | Discharge: 2012-10-14 | Disposition: A | Discharge status: Home / Self Care | Payer: Medicaid Other | Attending: Emergency Medicine | Admitting: Emergency Medicine

## 2012-10-14 DIAGNOSIS — Z3202 Encounter for pregnancy test, result negative: Secondary | ICD-10-CM | POA: Insufficient documentation

## 2012-10-14 DIAGNOSIS — Z8742 Personal history of other diseases of the female genital tract: Secondary | ICD-10-CM | POA: Insufficient documentation

## 2012-10-14 DIAGNOSIS — N949 Unspecified condition associated with female genital organs and menstrual cycle: Secondary | ICD-10-CM | POA: Insufficient documentation

## 2012-10-14 DIAGNOSIS — F17200 Nicotine dependence, unspecified, uncomplicated: Secondary | ICD-10-CM | POA: Insufficient documentation

## 2012-10-14 DIAGNOSIS — N938 Other specified abnormal uterine and vaginal bleeding: Secondary | ICD-10-CM | POA: Insufficient documentation

## 2012-10-14 HISTORY — DX: Other specified noninflammatory disorders of cervix uteri: N88.8

## 2012-10-14 LAB — URINALYSIS, ROUTINE W REFLEX MICROSCOPIC
Glucose, UA: NEGATIVE mg/dL
Ketones, ur: NEGATIVE mg/dL
Leukocytes, UA: NEGATIVE
Specific Gravity, Urine: 1.016 (ref 1.005–1.030)
pH: 6 (ref 5.0–8.0)

## 2012-10-14 LAB — CBC
Hemoglobin: 11.3 g/dL — ABNORMAL LOW (ref 12.0–15.0)
MCH: 27.6 pg (ref 26.0–34.0)
RBC: 4.1 MIL/uL (ref 3.87–5.11)

## 2012-10-14 MED ORDER — MEDROXYPROGESTERONE ACETATE 5 MG PO TABS: 10.0000 mg | ORAL_TABLET | Freq: Every day | ORAL | Status: DC

## 2012-10-14 MED ORDER — KETOROLAC TROMETHAMINE 60 MG/2ML IM SOLN
30.0000 mg | Freq: Once | INTRAMUSCULAR | Status: AC
  Administered 2012-10-14: 30 mg via INTRAMUSCULAR
  Filled 2012-10-14: qty 2

## 2012-10-14 NOTE — ED Provider Notes (Signed)
Medical screening examination/treatment/procedure(s) were performed by non-physician practitioner and as supervising physician I was immediately available for consultation/collaboration.   William Veta Dambrosia, MD 10/14/12 1829 

## 2012-10-14 NOTE — ED Provider Notes (Signed)
CSN: [id number]     Arrival date & time 10/14/12  1511 History   First MD Initiated Contact with Patient 10/14/12 1523     Chief Complaint  Patient presents with  . Vaginal Bleeding   (Consider location/radiation/quality/duration/timing/severity/associated sxs/prior Treatment) HPI  [name] [name] is a 22 y.o. female  Who is otherwise healthy c/o heavy menstruation with 5 pads per day x5 weeks associated with sharp bilateral lower abdominal, decreased PO intake, fatigue. Pt denies lightheaded sensation, CP, SOB, N/V, change in bladder habits. Not taking any birth control was on depot shot but she d/c'd it in April secondary to weight gain.   States she doe not have OB/gyn since she gave birth 8 months ago at [facility] , states she just comes to the ED.    Past Medical History  Diagnosis Date  . No pertinent past medical history   . Cervical cyst    Past Surgical History  Procedure Laterality Date  . No past surgeries    . Dilation and curettage of uterus      TAB    Family History  Problem Relation Age of Onset  . Other Neg Hx    History  Substance Use Topics  . Smoking status: Current Every Day Smoker -- 0.25 packs/day for 8 years    Types: Cigarettes  . Smokeless tobacco: Never Used  . Alcohol Use: Yes     Comment: occ   OB History   Grav Para Term Preterm Abortions TAB SAB Ect Mult Living   6 1 1  0 5 1 4  0 0 1     Review of Systems 10 systems reviewed and found to be negative, except as noted in the HPI  Allergies  Coconut fatty acids and Vicodin  Home Medications   Current Outpatient Rx  Name  Route  Sig  Dispense  Refill  . diphenhydrAMINE (BENADRYL) 25 MG tablet   Oral   Take 25 mg by mouth every 6 (six) hours as needed for itching or allergies.         Marland Kitchen ibuprofen (ADVIL,MOTRIN) 200 MG tablet   Oral   Take 400 mg by mouth every 6 (six) hours as needed for pain.         . traMADol (ULTRAM) 50 MG tablet   Oral   Take 1 tablet (50 mg total) by mouth  every 6 (six) hours as needed for pain.   10 tablet   0    BP 110/73  Pulse 82  Temp(Src) 98.1 F (36.7 C) (Oral)  Resp 18  SpO2 99% Physical Exam  Nursing note and vitals reviewed. Constitutional: She is oriented to person, place, and time. She appears well-developed and well-nourished. No distress.  HENT:  Head: Normocephalic and atraumatic.  Mouth/Throat: Oropharynx is clear and moist.  Eyes: Conjunctivae and EOM are normal. Pupils are equal, round, and reactive to light.  Neck: Normal range of motion. Neck supple.  Cardiovascular: Normal rate, regular rhythm, normal heart sounds and intact distal pulses.   No Conjunctival pallor  Pulmonary/Chest: Effort normal and breath sounds normal. No stridor. No respiratory distress. She has no wheezes. She has no rales. She exhibits no tenderness.  Abdominal: Soft. Bowel sounds are normal. She exhibits no distension and no mass. There is no tenderness. There is no rebound and no guarding.  Musculoskeletal: Normal range of motion. She exhibits no edema and no tenderness.  Electronic ankle monitor  Neurological: She is alert and oriented to person, place, and time.  Skin: Skin is warm.  Psychiatric: She has a normal mood and affect.    ED Course  Procedures (including critical care time) Labs Review Labs Reviewed  CBC - Abnormal; Notable for the following:    Hemoglobin 11.3 (*)    HCT 34.3 (*)    All other components within normal limits  PREGNANCY, URINE  URINALYSIS, ROUTINE W REFLEX MICROSCOPIC   Imaging Review No results found.  MDM   1. DUB (dysfunctional uterine bleeding)      Filed Vitals:   10/14/12 1515 10/14/12 1700  BP: 110/73 102/76  Pulse: 82 53  Temp: 98.1 F (36.7 C)   TempSrc: Oral   Resp: 18   SpO2: 99% 100%     [name] [name] is a 22 y.o. female menstruating for 5 weeks, going through 5 pads per day. Does not appear clinically dehydrated or anemic. Patient is not pregnant. I will start on Provera  10 mg for 10 days and asked her to follow with [facility] clinic. Return precautions discussed.   Medications  ketorolac (TORADOL) injection 30 mg (30 mg Intramuscular Given 10/14/12 1656)    Pt is hemodynamically stable, appropriate for, and amenable to discharge at this time. Pt verbalized understanding and agrees with care plan. All questions answered. Outpatient follow-up and specific return precautions discussed.    New Prescriptions   MEDROXYPROGESTERONE (PROVERA) 5 MG TABLET    Take 2 tablets (10 mg total) by mouth daily.    Note: Portions of this report may have been transcribed using voice recognition software. Every effort was made to ensure accuracy; however, inadvertent computerized transcription errors may be present    Wynetta Emery, PA-C 10/14/12 1729

## 2012-10-14 NOTE — ED Notes (Addendum)
C/o heavy vaginal bleeding daily for past month, recently her lower back has started to hurt and she has been feeling fatigued.

## 2012-10-23 ENCOUNTER — Encounter (HOSPITAL_COMMUNITY): Payer: Self-pay | Admitting: *Deleted

## 2012-10-23 ENCOUNTER — Inpatient Hospital Stay (HOSPITAL_COMMUNITY)
Admission: AD | Admit: 2012-10-23 | Discharge: 2012-10-23 | Disposition: A | Discharge status: Home / Self Care | Payer: Self-pay | Source: Ambulatory Visit | Attending: Obstetrics & Gynecology | Admitting: Obstetrics & Gynecology

## 2012-10-23 ENCOUNTER — Inpatient Hospital Stay (HOSPITAL_COMMUNITY): Payer: Self-pay

## 2012-10-23 DIAGNOSIS — N939 Abnormal uterine and vaginal bleeding, unspecified: Secondary | ICD-10-CM

## 2012-10-23 DIAGNOSIS — N938 Other specified abnormal uterine and vaginal bleeding: Secondary | ICD-10-CM | POA: Insufficient documentation

## 2012-10-23 DIAGNOSIS — A5901 Trichomonal vulvovaginitis: Secondary | ICD-10-CM | POA: Insufficient documentation

## 2012-10-23 DIAGNOSIS — N949 Unspecified condition associated with female genital organs and menstrual cycle: Secondary | ICD-10-CM | POA: Insufficient documentation

## 2012-10-23 DIAGNOSIS — R109 Unspecified abdominal pain: Secondary | ICD-10-CM | POA: Insufficient documentation

## 2012-10-23 DIAGNOSIS — N854 Malposition of uterus: Secondary | ICD-10-CM | POA: Insufficient documentation

## 2012-10-23 LAB — URINALYSIS, ROUTINE W REFLEX MICROSCOPIC
Leukocytes, UA: NEGATIVE
Nitrite: NEGATIVE
Specific Gravity, Urine: 1.02 (ref 1.005–1.030)
pH: 7 (ref 5.0–8.0)

## 2012-10-23 LAB — CBC
Hemoglobin: 11 g/dL — ABNORMAL LOW (ref 12.0–15.0)
RBC: 4.08 MIL/uL (ref 3.87–5.11)
WBC: 6.8 10*3/uL (ref 4.0–10.5)

## 2012-10-23 LAB — POCT PREGNANCY, URINE: Preg Test, Ur: NEGATIVE

## 2012-10-23 LAB — WET PREP, GENITAL: Yeast Wet Prep HPF POC: NONE SEEN

## 2012-10-23 MED ORDER — METRONIDAZOLE 500 MG PO TABS
2000.0000 mg | ORAL_TABLET | Freq: Once | ORAL | Status: AC
  Administered 2012-10-23: 2000 mg via ORAL
  Filled 2012-10-23: qty 4

## 2012-10-23 MED ORDER — MEGESTROL ACETATE 20 MG PO TABS: 20.0000 mg | ORAL_TABLET | Freq: Every day | ORAL | Status: DC

## 2012-10-23 MED ORDER — OXYCODONE-ACETAMINOPHEN 5-325 MG PO TABS
1.0000 | ORAL_TABLET | Freq: Once | ORAL | Status: AC
  Administered 2012-10-23: 1 via ORAL
  Filled 2012-10-23: qty 1

## 2012-10-23 MED ORDER — OXYCODONE-ACETAMINOPHEN 5-325 MG PO TABS
1.0000 | ORAL_TABLET | ORAL | Status: DC | PRN
Start: 2012-10-23 — End: 2013-04-16

## 2012-10-23 MED ORDER — ONDANSETRON 4 MG PO TBDP
4.0000 mg | ORAL_TABLET | Freq: Once | ORAL | Status: AC
  Administered 2012-10-23: 4 mg via ORAL
  Filled 2012-10-23: qty 1

## 2012-10-23 NOTE — MAU Note (Signed)
Explained to patient that a urine specimen is needed to complete a urinalysis and pregnancy test. Given cranberry juice.

## 2012-10-23 NOTE — MAU Note (Signed)
Patient is in with c/o heavy vaginal bleeding for 2 months and abdominal pain. Patient delivered 9 months ago and was given a dose of depo provera before discharge. She did not receive another dose per patient due to weight gain. Patient states that she was seen at [facility] last week and was prescribed provera. She states that she took it twice a day for 5 days. Stopped taking it due to heavier bleeding. She c/o of fatigue, she is not breastfeeding.

## 2012-10-23 NOTE — MAU Note (Signed)
POCT negative 

## 2012-10-23 NOTE — MAU Provider Note (Signed)
History     CSN: [id number]  Arrival date and time: 10/23/12 0907   None     Chief Complaint  Patient presents with  . Vaginal Bleeding  . Abdominal Pain  . Fatigue   HPI  [name] is a 22 y.o. non- pregnant female [phone], who presents with vaginal bleeding times 2 months and abdominal cramping. She states she cannot stand the abdominal pain, however has not taken anything for the pain. She has an appointment with the clinic on Oct 27 and is unsure if she will keep the appointment because of her financial situation. She completed the provera prescription that the ER prescribed her and feels her bleeding is worse. She is changing 6 pads per day; she is not currently taking Iron pills.  She is not on birth control pills; she has tried depo and it made her gain 30 lbs.   OB History   Grav Para Term Preterm Abortions TAB SAB Ect Mult Living   6 1 1  0 5 1 4  0 0 1      Past Medical History  Diagnosis Date  . No pertinent past medical history   . Cervical cyst     Past Surgical History  Procedure Laterality Date  . No past surgeries    . Dilation and curettage of uterus      TAB     Family History  Problem Relation Age of Onset  . Other Neg Hx   . Stroke Mother   . Cancer Maternal Grandmother     History  Substance Use Topics  . Smoking status: Current Every Day Smoker -- 0.25 packs/day for 8 years    Types: Cigarettes  . Smokeless tobacco: Never Used  . Alcohol Use: Yes     Comment: occ    Allergies:  Allergies  Allergen Reactions  . Coconut Fatty Acids Anaphylaxis and Hives  . Vicodin [Hydrocodone-Acetaminophen] Nausea And Vomiting    No prescriptions prior to admission   Recent Results (from the past 2160 hour(s))  CBC     Status: Abnormal   Collection Time    10/14/12  3:35 PM      Result Value Range   WBC 8.7  4.0 - 10.5 K/uL   RBC 4.10  3.87 - 5.11 MIL/uL   Hemoglobin 11.3 (*) 12.0 - 15.0 g/dL   HCT 14.7 (*) 82.9 - 56.2 %   MCV 83.7   78.0 - 100.0 fL   MCH 27.6  26.0 - 34.0 pg   MCHC 32.9  30.0 - 36.0 g/dL   RDW 13.0  86.5 - 78.4 %   Platelets 293  150 - 400 K/uL  PREGNANCY, URINE     Status: None   Collection Time    10/14/12  4:30 PM      Result Value Range   Preg Test, Ur NEGATIVE  NEGATIVE   Comment:            THE SENSITIVITY OF THIS     METHODOLOGY IS >20 mIU/mL.  URINALYSIS, ROUTINE W REFLEX MICROSCOPIC     Status: None   Collection Time    10/14/12  4:30 PM      Result Value Range   Color, Urine YELLOW  YELLOW   APPearance CLEAR  CLEAR   Specific Gravity, Urine 1.016  1.005 - 1.030   pH 6.0  5.0 - 8.0   Glucose, UA NEGATIVE  NEGATIVE mg/dL   Hgb urine dipstick NEGATIVE  NEGATIVE  Bilirubin Urine NEGATIVE  NEGATIVE   Ketones, ur NEGATIVE  NEGATIVE mg/dL   Protein, ur NEGATIVE  NEGATIVE mg/dL   Urobilinogen, UA 0.2  0.0 - 1.0 mg/dL   Nitrite NEGATIVE  NEGATIVE   Leukocytes, UA NEGATIVE  NEGATIVE   Comment: MICROSCOPIC NOT DONE ON URINES WITH NEGATIVE PROTEIN, BLOOD, LEUKOCYTES, NITRITE, OR GLUCOSE <1000 mg/dL.  URINALYSIS, ROUTINE W REFLEX MICROSCOPIC     Status: Abnormal   Collection Time    10/23/12  9:15 AM      Result Value Range   Color, Urine YELLOW  YELLOW   APPearance CLEAR  CLEAR   Specific Gravity, Urine 1.020  1.005 - 1.030   pH 7.0  5.0 - 8.0   Glucose, UA NEGATIVE  NEGATIVE mg/dL   Hgb urine dipstick LARGE (*) NEGATIVE   Bilirubin Urine NEGATIVE  NEGATIVE   Ketones, ur NEGATIVE  NEGATIVE mg/dL   Protein, ur NEGATIVE  NEGATIVE mg/dL   Urobilinogen, UA 0.2  0.0 - 1.0 mg/dL   Nitrite NEGATIVE  NEGATIVE   Leukocytes, UA NEGATIVE  NEGATIVE  URINE MICROSCOPIC-ADD ON     Status: None   Collection Time    10/23/12  9:15 AM      Result Value Range   Squamous Epithelial / LPF RARE  RARE   WBC, UA 0-2  <3 WBC/hpf   RBC / HPF 11-20  <3 RBC/hpf  CBC     Status: Abnormal   Collection Time    10/23/12  9:46 AM      Result Value Range   WBC 6.8  4.0 - 10.5 K/uL   RBC 4.08  3.87 - 5.11  MIL/uL   Hemoglobin 11.0 (*) 12.0 - 15.0 g/dL   HCT 16.1 (*) 09.6 - 04.5 %   MCV 82.8  78.0 - 100.0 fL   MCH 27.0  26.0 - 34.0 pg   MCHC 32.5  30.0 - 36.0 g/dL   RDW 40.9  81.1 - 91.4 %   Platelets 382  150 - 400 K/uL  WET PREP, GENITAL     Status: Abnormal   Collection Time    10/23/12 10:18 AM      Result Value Range   Yeast Wet Prep HPF POC NONE SEEN  NONE SEEN   Trich, Wet Prep MODERATE (*) NONE SEEN   Clue Cells Wet Prep HPF POC FEW (*) NONE SEEN   WBC, Wet Prep HPF POC FEW (*) NONE SEEN   Comment: MODERATE BACTERIA SEEN  GC/CHLAMYDIA PROBE AMP     Status: None   Collection Time    10/23/12 10:18 AM      Result Value Range   CT Probe RNA NEGATIVE  NEGATIVE   GC Probe RNA NEGATIVE  NEGATIVE   Comment: (NOTE)                                                                                         Assay performed using the Gen-Probe APTIMA COMBO2 (R) Assay.     Acceptable specimen types for this assay include APTIMA Swabs (Unisex,     endocervical, urethral, or vaginal), first void urine, and ThinPrep  liquid based cytology samples.     Performed at [facility]  POCT PREGNANCY, URINE     Status: None   Collection Time    10/23/12 10:56 AM      Result Value Range   Preg Test, Ur NEGATIVE  NEGATIVE   Comment:            THE SENSITIVITY OF THIS     METHODOLOGY IS >24 mIU/mL      CLINICAL DATA: Heavy bleeding for 2 months.  EXAM:  TRANSABDOMINAL AND TRANSVAGINAL ULTRASOUND OF PELVIS  TECHNIQUE:  Both transabdominal and transvaginal ultrasound examinations of the  pelvis were performed. Transabdominal technique was performed for  global imaging of the pelvis including uterus, ovaries, adnexal  regions, and pelvic cul-de-sac. It was necessary to proceed with  endovaginal exam following the transabdominal exam to visualize the  endometrium and bilateral ovaries.  COMPARISON: Ultrasound 02/06/2012  FINDINGS:  Uterus  Measurements: Measures 8.2 x 4.3 x 5.1  cm. Anteverted. No fibroids  or other mass visualized.  Endometrium  Thickness: Difficult to visualize however measures up to 1.0 cm. No  focal abnormality visualized.  Right ovary  Measurements: 4.0 x 2.9 x 2.8 cm. Contains a dominant follicle  measuring 2.5 x 2.0 x 1.9 cm.  Left ovary  Measurements: 3.4 x 1.2 x 1.0 cm. Normal appearance/no adnexal mass.  Other findings  No free fluid.  IMPRESSION:  1. Endometrium is difficult to evaluate however measures  approximately 10 mm. No definite focal endometrial abnormalities  identified. If bleeding remains unresponsive to hormonal or medical  therapy, sonohysterogram should be considered for focal lesion  work-up. (Ref: Radiological Reasoning: Algorithmic Workup of  Abnormal Vaginal Bleeding with Endovaginal Sonography and  Sonohysterography. AJR 2008; 409:W11-91)  Review of Systems  Constitutional: Positive for malaise/fatigue. Negative for fever and chills.  Gastrointestinal: Positive for abdominal pain. Negative for nausea, vomiting, diarrhea and constipation.  Genitourinary: Negative for dysuria, urgency, frequency and hematuria.       No vaginal discharge. + vaginal bleeding. No dysuria.    Physical Exam   Blood pressure 124/81, pulse 60, temperature 97.3 F (36.3 C), temperature source Oral, resp. rate 16, height 5\' 3"  (1.6 m), weight 173 lb 2 oz (78.529 kg), last menstrual period 08/23/2012, SpO2 100.00%, not currently breastfeeding.  Physical Exam  Constitutional: She is oriented to person, place, and time. She appears well-developed and well-nourished. No distress.  Neck: Neck supple.  Respiratory: Effort normal.  GI: Soft. She exhibits no distension. There is no tenderness. There is no rebound and no guarding.  Genitourinary:    Speculum exam: Vagina - Small amount of dark red blood in vaginal canal,  no odor Cervix - small contact bleeding, cylinder shaped mass at 1200 on top of cervix  Bimanual exam: Cervix  closed, mass palpated; tender on palpation  Uterus non tender, normal size, anteverted  Adnexa non tender, no masses bilaterally, suprapubic tenderness GC/Chlam, wet prep done Chaperone present for exam.   Neurological: She is alert and oriented to person, place, and time.  Skin: Skin is warm and dry. She is not diaphoretic.    MAU Course  Procedures None  MDM UA UPT Wet Prep GC/Chlamydia  Transvaginal US- anteverted uterus  Percocet times 1 tab  Consulted with Dr. Debroah Loop regarding patients cervical exam and vaginal bleeding. Plan in place, have pt keep her appointment in the clinic.   Treatment for trichomas completed in MAU; flagyl 2 gram PO with zofran 4 mg given  prior. Pt rates her pain 2/10 following percocet.   Assessment and Plan  A: Abnormal Vaginal bleeding Growth on cervix  Trichomonas   P: Discharge home Return if symptoms worsen RX: Megace- see instructions for dosing          Percocet  Keep your clinic appointment Bleeding precautions discussed  You have been diagnosed with a sexually transmitted disease.  You have been treated and your partner(s) will need to be treated.  No sex until 10 days after you finished your medicine and no sex until 10 days after your partner has taken their medication.  Nasean Zapf IRENE FNP-C 10/25/2012, 2:53 PM

## 2012-10-23 NOTE — MAU Note (Signed)
Patient presents with complaint of vaginal bleeding x 2 months. States she delivered her son 9 months ago and rec'd the depo injection in January but didn't like the weight gain associated with the drug. Was prescribed 5 days of Provera and took 5mg  BID as prescribed until she completed the prescription but the bleeding has not stopped.

## 2012-10-23 NOTE — MAU Note (Signed)
Patient unable to void on arrival.

## 2012-10-24 LAB — GC/CHLAMYDIA PROBE AMP: CT Probe RNA: NEGATIVE

## 2012-11-12 ENCOUNTER — Encounter: Payer: Medicaid Other | Admitting: Advanced Practice Midwife

## 2013-04-16 ENCOUNTER — Ambulatory Visit (HOSPITAL_COMMUNITY): Payer: Self-pay

## 2013-04-16 ENCOUNTER — Encounter (HOSPITAL_COMMUNITY): Payer: Self-pay | Admitting: Emergency Medicine

## 2013-04-16 ENCOUNTER — Emergency Department (HOSPITAL_COMMUNITY): Payer: Self-pay

## 2013-04-16 ENCOUNTER — Emergency Department (HOSPITAL_COMMUNITY)
Admission: EM | Admit: 2013-04-16 | Discharge: 2013-04-16 | Disposition: A | Discharge status: Home / Self Care | Payer: Self-pay | Attending: Emergency Medicine | Admitting: Emergency Medicine

## 2013-04-16 DIAGNOSIS — R059 Cough, unspecified: Secondary | ICD-10-CM | POA: Insufficient documentation

## 2013-04-16 DIAGNOSIS — Z8742 Personal history of other diseases of the female genital tract: Secondary | ICD-10-CM | POA: Insufficient documentation

## 2013-04-16 DIAGNOSIS — R062 Wheezing: Secondary | ICD-10-CM | POA: Insufficient documentation

## 2013-04-16 DIAGNOSIS — F17200 Nicotine dependence, unspecified, uncomplicated: Secondary | ICD-10-CM | POA: Insufficient documentation

## 2013-04-16 DIAGNOSIS — R5383 Other fatigue: Secondary | ICD-10-CM

## 2013-04-16 DIAGNOSIS — R5381 Other malaise: Secondary | ICD-10-CM | POA: Insufficient documentation

## 2013-04-16 DIAGNOSIS — H9209 Otalgia, unspecified ear: Secondary | ICD-10-CM | POA: Insufficient documentation

## 2013-04-16 DIAGNOSIS — J020 Streptococcal pharyngitis: Secondary | ICD-10-CM | POA: Insufficient documentation

## 2013-04-16 DIAGNOSIS — R110 Nausea: Secondary | ICD-10-CM | POA: Insufficient documentation

## 2013-04-16 DIAGNOSIS — R05 Cough: Secondary | ICD-10-CM | POA: Insufficient documentation

## 2013-04-16 DIAGNOSIS — R630 Anorexia: Secondary | ICD-10-CM | POA: Insufficient documentation

## 2013-04-16 LAB — RAPID STREP SCREEN (MED CTR MEBANE ONLY): Streptococcus, Group A Screen (Direct): POSITIVE — AB

## 2013-04-16 MED ORDER — DEXAMETHASONE SODIUM PHOSPHATE 10 MG/ML IJ SOLN
10.0000 mg | Freq: Once | INTRAMUSCULAR | Status: AC
  Administered 2013-04-16: 10 mg via INTRAMUSCULAR
  Filled 2013-04-16: qty 1

## 2013-04-16 MED ORDER — TRAMADOL HCL 50 MG PO TABS: 50.0000 mg | ORAL_TABLET | Freq: Four times a day (QID) | ORAL | Status: DC | PRN

## 2013-04-16 MED ORDER — KETOROLAC TROMETHAMINE 60 MG/2ML IM SOLN
60.0000 mg | Freq: Once | INTRAMUSCULAR | Status: AC
  Administered 2013-04-16: 60 mg via INTRAMUSCULAR
  Filled 2013-04-16: qty 2

## 2013-04-16 MED ORDER — IPRATROPIUM BROMIDE 0.02 % IN SOLN
0.5000 mg | Freq: Once | RESPIRATORY_TRACT | Status: AC
  Administered 2013-04-16: 0.5 mg via RESPIRATORY_TRACT
  Filled 2013-04-16: qty 2.5

## 2013-04-16 MED ORDER — ALBUTEROL SULFATE HFA 108 (90 BASE) MCG/ACT IN AERS
2.0000 | INHALATION_SPRAY | RESPIRATORY_TRACT | Status: DC | PRN
  Administered 2013-04-16: 2 via RESPIRATORY_TRACT
  Filled 2013-04-16: qty 6.7

## 2013-04-16 MED ORDER — PENICILLIN G BENZATHINE 1200000 UNIT/2ML IM SUSP
1.2000 10*6.[IU] | Freq: Once | INTRAMUSCULAR | Status: AC
  Administered 2013-04-16: 1.2 10*6.[IU] via INTRAMUSCULAR
  Filled 2013-04-16: qty 2

## 2013-04-16 MED ORDER — ALBUTEROL SULFATE (2.5 MG/3ML) 0.083% IN NEBU
5.0000 mg | INHALATION_SOLUTION | Freq: Once | RESPIRATORY_TRACT | Status: AC
  Administered 2013-04-16: 5 mg via RESPIRATORY_TRACT
  Filled 2013-04-16: qty 6

## 2013-04-16 MED ORDER — NAPROXEN 500 MG PO TABS: 500.0000 mg | ORAL_TABLET | Freq: Two times a day (BID) | ORAL | Status: DC

## 2013-04-16 NOTE — ED Provider Notes (Signed)
CSN: [id number]     Arrival date & time 04/16/13  1529 History   This chart was scribed for non-physician practitioner Renne CriglerJoshua Sindhu Nguyen, PA-C, working with Flint MelterElliott L Wentz, MD, by Yevette EdwardsAngela Bracken, ED Scribe. This patient was seen in room WTR5/WTR5 and the patient's care was started at 4:25 PM. First MD Initiated Contact with Patient 04/16/13 1609     Chief Complaint  Patient presents with  . Sore Throat  . Otalgia   The history is provided by the patient. No language interpreter was used.   HPI Comments: [name] [name] is a 23 y.o. female, with a h/o asthma, who presents to the Emergency Department complaining of sore throat which began two weeks ago, but which worsened two days. She states pain increased with eating. She also reports otalgia, a cough, nausea, and a subjective fever. She states the cough has persisted for two weeks. At home, her maximum temperature was 100 F; in the ED her temperature is 99 F. She has treated the symptoms with Tylenol, Robitussin, and chloraseptic spray without resolution. The pt reports multiple sick contacts. She denies medication for asthma. She is a current smoker. Onset is acute. Course is worsening. Aggravating factors are eating. Alleviating factors are none.    Past Medical History  Diagnosis Date  . No pertinent past medical history   . Cervical cyst    Past Surgical History  Procedure Laterality Date  . No past surgeries    . Dilation and curettage of uterus      TAB    Family History  Problem Relation Age of Onset  . Other Neg Hx   . Stroke Mother   . Cancer Maternal Grandmother    History  Substance Use Topics  . Smoking status: Current Every Day Smoker -- 0.25 packs/day for 8 years    Types: Cigarettes  . Smokeless tobacco: Never Used  . Alcohol Use: Yes     Comment: occ   OB History   Grav Para Term Preterm Abortions TAB SAB Ect Mult Living   6 1 1  0 5 1 4  0 0 1     Review of Systems  Constitutional: Positive for fever (99 F in  the ED), appetite change and fatigue. Negative for chills.  HENT: Positive for ear pain and sore throat. Negative for congestion, rhinorrhea and sinus pressure.   Eyes: Negative for redness.  Respiratory: Positive for cough and wheezing.   Gastrointestinal: Positive for nausea. Negative for vomiting, abdominal pain and diarrhea.  Genitourinary: Negative for dysuria.  Musculoskeletal: Negative for myalgias and neck stiffness.  Skin: Negative for rash.  Neurological: Negative for headaches.  Hematological: Negative for adenopathy.    Allergies  Coconut fatty acids and Vicodin  Home Medications  No current outpatient prescriptions on file.  Triage Vitals: BP 120/83  Pulse 75  Temp(Src) 99 F (37.2 C) (Oral)  Resp 18  SpO2 100%  LMP 03/12/2013  Physical Exam  Nursing note and vitals reviewed. Constitutional: She appears well-developed and well-nourished. No distress.  HENT:  Head: Normocephalic and atraumatic.  Right Ear: Tympanic membrane, external ear and ear canal normal.  Left Ear: Tympanic membrane, external ear and ear canal normal.  Nose: Nose normal. No mucosal edema or rhinorrhea.  Mouth/Throat: Uvula is midline, oropharynx is clear and moist and mucous membranes are normal. Mucous membranes are not dry. No oral lesions. No trismus in the jaw. No uvula swelling. No oropharyngeal exudate, posterior oropharyngeal edema, posterior oropharyngeal erythema or tonsillar abscesses.  Swollen tonsils. Positive erythema.  No tonsillar abscess. No exudate.    Eyes: Conjunctivae and EOM are normal. Right eye exhibits no discharge. Left eye exhibits no discharge.  Neck: Normal range of motion. Neck supple. No tracheal deviation present.  Cardiovascular: Normal rate, regular rhythm and normal heart sounds.   No murmur heard. Pulmonary/Chest: Effort normal. No respiratory distress. She has wheezes. She has no rales.  Coughing during exam.   Abdominal: Soft. There is no tenderness.   Musculoskeletal: Normal range of motion.  Lymphadenopathy:    She has no cervical adenopathy.  Neurological: She is alert.  Skin: Skin is warm and dry.  Psychiatric: She has a normal mood and affect. Her behavior is normal.    ED Course  Procedures (including critical care time)  DIAGNOSTIC STUDIES: Oxygen Saturation is 100% on room air, normal by my interpretation.    COORDINATION OF CARE:  4:45 PM- Discussed treatment plan with patient, and the patient agreed to the plan. The plan includes a strep culture, a chest x-ray, and a breathing treatment.   Labs Review Labs Reviewed  RAPID STREP SCREEN - Abnormal; Notable for the following:    Streptococcus, Group A Screen (Direct) POSITIVE (*)    All other components within normal limits   Imaging Review No results found.   EKG Interpretation None      Vital signs reviewed and are as follows: Filed Vitals:   04/16/13 1756  BP:   Pulse: 80  Temp: 98.4 F (36.9 C)  Resp: 18   Pt improved with albuterol neb. Wheezing resolved on re-exam.   Bicillin for + strep. Decadron and toradol for throat pain and swelling.   Patient counseled on supportive care and s/s to return including worsening symptoms, persistent fever, persistent vomiting, or if they have any other concerns. Urged to see PCP if symptoms persist for more than 3 days. Patient verbalizes understanding and agrees with plan.   Patient counseled on use of narcotic pain medications. Counseled not to combine these medications with others containing tylenol. Urged not to drink alcohol, drive, or perform any other activities that requires focus while taking these medications. The patient verbalizes understanding and agrees with the plan.    MDM   Final diagnoses:  Streptococcal pharyngitis   Patient with + strep. Also cough and wheezing, lungs cleared and patient feeling better after neb. She is non-toxic in appearance. Conservative management otherwise indicated.    I personally performed the services described in this documentation, which was scribed in my presence. The recorded information has been reviewed and is accurate.     Renne Crigler, PA-C 04/16/13 2030

## 2013-04-16 NOTE — ED Notes (Signed)
Pt c/o sore throat and otalgia x 2 days.

## 2013-04-16 NOTE — Discharge Instructions (Signed)
Please read and follow all provided instructions.  Your diagnoses today include:  1. Streptococcal pharyngitis    Tests performed today include:  Strep test: was POSITIVE for strep throat  Vital signs. See below for your results today.   Medications prescribed:  You were given a one-time shot of penicillin to treat your strep throat.   Take any medications prescribed only as directed.    Tramadol - narcotic-like pain medication  DO NOT drive or perform any activities that require you to be awake and alert because this medicine can make you drowsy.    Naproxen - anti-inflammatory pain medication  Do not exceed 500mg  naproxen every 12 hours, take with food  You have been prescribed an anti-inflammatory medication or NSAID. Take with food. Take smallest effective dose for the shortest duration needed for your pain. Stop taking if you experience stomach pain or vomiting.   Home care instructions:  Please read the educational materials provided and follow any instructions contained in this packet.  Follow-up instructions: Please follow-up with your primary care provider as needed for further evaluation of your symptoms.  If you do not have a primary care doctor -- see below for referral information.   Return instructions:   Please return to the Emergency Department if you experience worsening symptoms.   Return if you have worsening problems swallowing, your neck becomes swollen, you cannot swallow your saliva or your voice becomes muffled.   Return with high persistent fever, persistent vomiting, or if you have trouble breathing.   Please return if you have any other emergent concerns.  Additional Information:  Your vital signs today were: BP 120/83   Pulse 80   Temp(Src) 98.4 F (36.9 C) (Oral)   Resp 18   SpO2 100%   LMP 03/12/2013 If your blood pressure (BP) was elevated above 135/85 this visit, please have this repeated by your doctor within one  month. --------------

## 2013-04-16 NOTE — ED Provider Notes (Signed)
Medical screening examination/treatment/procedure(s) were performed by non-physician practitioner and as supervising physician I was immediately available for consultation/collaboration.  Edwardine Deschepper L Azriel Dancy, MD 04/16/13 2323 

## 2013-04-27 ENCOUNTER — Encounter (HOSPITAL_COMMUNITY): Payer: Self-pay | Admitting: Emergency Medicine

## 2013-04-27 ENCOUNTER — Emergency Department (HOSPITAL_COMMUNITY)
Admission: EM | Admit: 2013-04-27 | Discharge: 2013-04-27 | Disposition: A | Discharge status: Home / Self Care | Payer: Self-pay | Attending: Emergency Medicine | Admitting: Emergency Medicine

## 2013-04-27 DIAGNOSIS — R6883 Chills (without fever): Secondary | ICD-10-CM | POA: Insufficient documentation

## 2013-04-27 DIAGNOSIS — F17200 Nicotine dependence, unspecified, uncomplicated: Secondary | ICD-10-CM | POA: Insufficient documentation

## 2013-04-27 DIAGNOSIS — IMO0002 Reserved for concepts with insufficient information to code with codable children: Secondary | ICD-10-CM | POA: Insufficient documentation

## 2013-04-27 DIAGNOSIS — Z8742 Personal history of other diseases of the female genital tract: Secondary | ICD-10-CM | POA: Insufficient documentation

## 2013-04-27 DIAGNOSIS — Z792 Long term (current) use of antibiotics: Secondary | ICD-10-CM | POA: Insufficient documentation

## 2013-04-27 DIAGNOSIS — Z791 Long term (current) use of non-steroidal anti-inflammatories (NSAID): Secondary | ICD-10-CM | POA: Insufficient documentation

## 2013-04-27 DIAGNOSIS — L0390 Cellulitis, unspecified: Secondary | ICD-10-CM

## 2013-04-27 MED ORDER — OXYCODONE HCL 5 MG PO TABS
10.0000 mg | ORAL_TABLET | Freq: Once | ORAL | Status: AC
  Administered 2013-04-27: 10 mg via ORAL
  Filled 2013-04-27: qty 2

## 2013-04-27 MED ORDER — SULFAMETHOXAZOLE-TRIMETHOPRIM 800-160 MG PO TABS: 1.0000 | ORAL_TABLET | Freq: Two times a day (BID) | ORAL | Status: DC

## 2013-04-27 MED ORDER — ONDANSETRON 4 MG PO TBDP
4.0000 mg | ORAL_TABLET | Freq: Once | ORAL | Status: AC
  Administered 2013-04-27: 4 mg via ORAL
  Filled 2013-04-27: qty 1

## 2013-04-27 MED ORDER — ONDANSETRON HCL 4 MG PO TABS: 4.0000 mg | ORAL_TABLET | Freq: Four times a day (QID) | ORAL | Status: DC

## 2013-04-27 MED ORDER — CEPHALEXIN 500 MG PO CAPS: 500.0000 mg | ORAL_CAPSULE | Freq: Four times a day (QID) | ORAL | Status: DC

## 2013-04-27 MED ORDER — HYDROCODONE-ACETAMINOPHEN 5-325 MG PO TABS: 1.0000 | ORAL_TABLET | ORAL | Status: DC | PRN

## 2013-04-27 NOTE — ED Notes (Signed)
Discharge delayed due pt pt request to change her pain med

## 2013-04-27 NOTE — ED Provider Notes (Signed)
CSN: [id number]     Arrival date & time 04/27/13  1725 History  This chart was scribed for non-physician practitioner, Mellody Drown, PA-C, working with Enid Skeens, MD by Smiley Houseman, ED Scribe. This patient was seen in room WTR6/WTR6 and the patient's care was started at 5:58 PM.  Chief Complaint  Patient presents with  . Insect Bite   The history is provided by the patient. No language interpreter was used.   HPI Comments: [name] [name] is a 23 y.o. female who presents to the Emergency Department complaining of a possible insect bite to her left elbow that she noticed yesterday morning.  Pt states the associated redness and swelling has rapidly worsened today.  Pt reports she thought she injured her elbow due to the pain and swelling.  Pt states she has had chills for 1 day, denies fever.  ED temperature is 99.103F.  Pt denies taking anything for pain PTA.  Pt denies h/o diabetes.  Reports tolerated norco and percocet in the past, reports mild nausea.    Past Medical History  Diagnosis Date  . No pertinent past medical history   . Cervical cyst    Past Surgical History  Procedure Laterality Date  . No past surgeries    . Dilation and curettage of uterus      TAB    Family History  Problem Relation Age of Onset  . Other Neg Hx   . Stroke Mother   . Cancer Maternal Grandmother    History  Substance Use Topics  . Smoking status: Current Every Day Smoker -- 0.25 packs/day for 8 years    Types: Cigarettes  . Smokeless tobacco: Never Used  . Alcohol Use: Yes     Comment: occ   OB History   Grav Para Term Preterm Abortions TAB SAB Ect Mult Living   6 1 1  0 5 1 4  0 0 1     Review of Systems  Constitutional: Positive for chills. Negative for fever.  Respiratory: Negative for shortness of breath.   Cardiovascular: Negative for chest pain.  Gastrointestinal: Negative for nausea, vomiting, abdominal pain and diarrhea.  Musculoskeletal: Negative for arthralgias and myalgias.   Skin: Positive for color change and wound (Left elbow). Negative for rash.  Neurological: Negative for dizziness, light-headedness and headaches.  Psychiatric/Behavioral: Negative for behavioral problems and confusion.  All other systems reviewed and are negative.   Allergies  Coconut fatty acids and Vicodin  Home Medications   Current Outpatient Rx  Name  Route  Sig  Dispense  Refill  . cephALEXin (KEFLEX) 500 MG capsule   Oral   Take 1 capsule (500 mg total) by mouth 4 (four) times daily.   40 capsule   0   . HYDROcodone-acetaminophen (NORCO/VICODIN) 5-325 MG per tablet   Oral   Take 1 tablet by mouth every 4 (four) hours as needed.   10 tablet   0   . naproxen (NAPROSYN) 500 MG tablet   Oral   Take 1 tablet (500 mg total) by mouth 2 (two) times daily.   30 tablet   0   . sulfamethoxazole-trimethoprim (SEPTRA DS) 800-160 MG per tablet   Oral   Take 1 tablet by mouth 2 (two) times daily.   20 tablet   0   . traMADol (ULTRAM) 50 MG tablet   Oral   Take 1 tablet (50 mg total) by mouth every 6 (six) hours as needed.   15 tablet   0  Triage Vitals: BP 122/82  Pulse 65  Temp(Src) 99.5 F (37.5 C) (Oral)  Resp 16  SpO2 100%  LMP 03/12/2013  Physical Exam  Nursing note and vitals reviewed. Constitutional: She is oriented to person, place, and time. She appears well-developed and well-nourished.  Non-toxic appearance. She does not have a sickly appearance. No distress.  HENT:  Head: Normocephalic and atraumatic.  Eyes: Conjunctivae and EOM are normal. Right eye exhibits no discharge. Left eye exhibits no discharge.  Neck: Neck supple.  Cardiovascular: Normal rate.   Pulmonary/Chest: Effort normal. No respiratory distress.  Musculoskeletal: Normal range of motion.  Neurological: She is alert and oriented to person, place, and time.  Skin: Skin is warm and dry. No rash noted. She is not diaphoretic. There is erythema. No pallor.     Superior to the medial  epicondyle of left arm with a 5x8 erythema and 4x4 area of induration, no fluctuation.  Central single puncture lesion with scab.  No drainage noted. Good ROM to left elbow.  Psychiatric: She has a normal mood and affect. Her behavior is normal. Judgment and thought content normal.    ED Course  Procedures (including critical care time)  COORDINATION OF CARE: 6:07 PM-Will order ultra sound of left elbow tissue.  Will order oxycodone and Zofran.  Patient informed of current plan of treatment and evaluation and agrees with plan.    7:01 PM - Consulted with Dr. Jodi Mourning.  Informed pt to follow up with ortho and her PCP.  Will discharge with Keflex 500 mg, Septra, and Vicodin.     MDM   Final diagnoses:  Cellulitis    Pt with raised painful erythemic lesion to medial elbow. Superior aspect with induration, no fluctuation.  Bedside US shows cellulitis no drainable abscess. Re-eval patient sleeping in room. Discussed imaging results, and treatment plan with the patient. Return precautions given. Reports understanding and no other concerns at this time.  Patient is stable for discharge at this time.  Meds ordered this encounter  Medications  . oxyCODONE (Oxy IR/ROXICODONE) immediate release tablet 10 mg    Sig:   . ondansetron (ZOFRAN-ODT) disintegrating tablet 4 mg    Sig:   . DISCONTD: cephALEXin (KEFLEX) 500 MG capsule    Sig: Take 1 capsule (500 mg total) by mouth 4 (four) times daily.    Dispense:  40 capsule    Refill:  0    Order Specific Question:  Supervising Provider    Answer:  Eber Hong D [[id number]]  . DISCONTD: sulfamethoxazole-trimethoprim (SEPTRA DS) 800-160 MG per tablet    Sig: Take 1 tablet by mouth 2 (two) times daily.    Dispense:  20 tablet    Refill:  0    Order Specific Question:  Supervising Provider    Answer:  Eber Hong D [[id number]]  . HYDROcodone-acetaminophen (NORCO/VICODIN) 5-325 MG per tablet    Sig: Take 1 tablet by mouth every 4 (four) hours as needed.     Dispense:  10 tablet    Refill:  0    Order Specific Question:  Supervising Provider    Answer:  Eber Hong D [[id number]]  . sulfamethoxazole-trimethoprim (SEPTRA DS) 800-160 MG per tablet    Sig: Take 1 tablet by mouth 2 (two) times daily.    Dispense:  20 tablet    Refill:  0    Order Specific Question:  Supervising Provider    Answer:  Eber Hong D [[id number]]  . cephALEXin (KEFLEX) 500 MG capsule  Sig: Take 1 capsule (500 mg total) by mouth 4 (four) times daily.    Dispense:  40 capsule    Refill:  0    Order Specific Question:  Supervising Provider    Answer:  Eber HongMILLER, BRIAN D [[id number]]    I personally performed the services described in this documentation, which was scribed in my presence. The recorded information has been reviewed and is accurate.      Clabe SealLauren M Tamee Battin, PA-C 04/29/13 1353

## 2013-04-27 NOTE — ED Notes (Signed)
Pt states she thinks she was bitten by "something" near her L elbow. Pt has a red raised, painful area to L elbow. States now the fingers on her L hand are numb. ROM intact to L arm.

## 2013-04-27 NOTE — Discharge Instructions (Signed)
Call for a follow up appointment with a Family or Primary Care Provider.  Call Dr. Rennis ChrisSupple if pain increase in your elbow. Return if Symptoms worsen.   Take medication as prescribed.    Emergency Department Resource Guide 1) Find a Doctor and Pay Out of Pocket Although you won't have to find out who is covered by your insurance plan, it is a good idea to ask around and get recommendations. You will then need to call the office and see if the doctor you have chosen will accept you as a new patient and what types of options they offer for patients who are self-pay. Some doctors offer discounts or will set up payment plans for their patients who do not have insurance, but you will need to ask so you aren't surprised when you get to your appointment.  2) Contact Your Local Health Department Not all health departments have doctors that can see patients for sick visits, but many do, so it is worth a call to see if yours does. If you don't know where your local health department is, you can check in your phone book. The CDC also has a tool to help you locate your state's health department, and many state websites also have listings of all of their local health departments.  3) Find a Walk-in Clinic If your illness is not likely to be very severe or complicated, you may want to try a walk in clinic. These are popping up all over the country in pharmacies, drugstores, and shopping centers. They're usually staffed by nurse practitioners or physician assistants that have been trained to treat common illnesses and complaints. They're usually fairly quick and inexpensive. However, if you have serious medical issues or chronic medical problems, these are probably not your best option.  No Primary Care Doctor: - Call Health Connect at  [phone] - they can help you locate a primary care doctor that  accepts your insurance, provides certain services, etc. - Physician Referral Service- [phone]  Chronic Pain  Problems: Organization         Address  Phone   Notes  [facility]  [phone] Patients need to be referred by their primary care doctor.   Medication Assistance: Organization         Address  Phone   Notes  [facility] Medication [facility] [address] [place], [place] [phone] --Must be a resident of [place] [facility] -- Must have NO insurance coverage whatsoever (no Medicaid/ Medicare, etc.) -- The pt. MUST have a primary care doctor that directs their care regularly and follows them in the community   MedAssist  [phone]   [employer]  [phone]    Agencies that provide inexpensive medical care: Organization         Address  Phone   Notes  [facility]  [phone]   [facility]    [phone]   [facility] [address] [place], [place] [address] [phone]   [facility] [address], [place] [phone]   [facility]anned Parenthood    [phone]   [facility]    [phone]   [facility]  [address], [place] Phone:  [phone], Fax:  [phone] Hours of Operation:  9 am - 6 pm, M-F.  Also accepts Medicaid/Medicare and self-pay.  [facility]  [address], [place] Phone: [phone], Fax: [phone]. Hours of Operation:  8:30 am - 5:30 pm, M-F.  Also accepts Medicaid and self-pay.  [facility] [place] [address], [place] Phone: [phone]   [place], [place], [place] [phone], Ext. [phone] Mondays & Thursdays: 7-9 AM.  First 15 patients are seen on a first come, first serve basis.    [place] Providers:  Organization         Address  Phone   Notes  [facility] [address], [place] [phone] Also  accepts self-pay patients.  [facility] [address], [place]  [phone]   [place], [address], [place] [phone]   [facility] [address], [place] [phone]   Lucianne Lei [address], [place]   [phone] Only accepts [place] Access [place] patients after they have their name applied to their card.   Self-Pay (no insurance) in [facility]:  Organization         Address  Phone   Notes  Sickle Cell Patients, [facility] [place] [phone]   [facility] [place] [phone]   [facility] [place]  [place], [place], [place] [phone]   [facility]/Dr. Osei-Bonsu  [address] [place] or [address], [place] [phone] Phone number for both [place] and [place] locations is the same.  [facility] [address], [place] [phone]   [facility] [address], [place] or [address] [phone]   [facility] [address] [place] [phone], phone; [phone], fax Sees patients 1st and 3rd Saturday of every month.  Must not qualify for public or private insurance (i.e. Medicaid, Medicare, [place] Health Choice, Veterans' Benefits)  Household income should be no more than 200% of the poverty level The clinic cannot treat you if you are pregnant or think you are pregnant  Sexually transmitted diseases are not treated at the clinic.    Dental Care: Organization         Address  Phone  Notes  [facility] Department of [facility] [phone] Accepts children up to age 78 who are enrolled in [place] or [place]; pregnant  women with a Medicaid card; and children who have applied for Medicaid or [place] Health Choice, but were declined, whose parents can pay a reduced fee at time of service.  [facility] Department of [facility]  [address], [place] [phone] Accepts children up to age 58 who are enrolled in [place] or [place]; pregnant women with a Medicaid card; and children who have applied for Medicaid or [place] Health Choice, but were declined, whose parents can pay a reduced fee at time of service.  [facility]  [place] [phone] Patients are seen by appointment only. Walk-ins are not accepted. [place] will see patients 67 years of age and older. Monday - Tuesday (8am-5pm) Most Wednesdays (8:30-5pm) $30 per visit, cash only  [facility]  [address], [facility] [phone] Patients are seen by appointment only. Walk-ins are not accepted. [place] will see patients 29 years of age and older. One Wednesday Evening (Monthly: Volunteer Based).  $30 per visit, cash only  [place]  [phone] for adults; Children under age 108, call Graduate Pediatric Dentistry at [phone]. Children aged 52-14, please call [phone] to request a pediatric application.  Dental services are provided in all areas of dental care including fillings, crowns and bridges, complete and partial dentures, implants, gum treatment, root canals, and extractions. Preventive care is also provided. Treatment is provided to both adults and children. Patients are selected via a lottery and there is often a waiting list.   [facility] [address] [place]  [phone] [email or web address]   [facility] [address] [place], [place] [phone], Ext. [phone] Second and Fourth Thursday of each month, opens at 6:30 AM; Clinic ends at 9 AM.  Patients are  seen on a first-come first-served basis, and a limited number are seen during each clinic.   [facility]  [address] Hillard Danker [place], [place] [phone]   Eligibility Requirements You must have lived in [place], [place], or [place] counties for at least the last three months.   You cannot be eligible for state or federal sponsored [employer], including [employer], [place], or [employer].   You generally cannot be eligible for healthcare insurance through your employer.    How to apply: Eligibility screenings are held every Tuesday and Wednesday afternoon from 1:00 pm until 4:00 pm. You do not need an appointment for the interview!  [facility] [address], [place], [place]   [place]  [place] Department  [place]  [phone]    Behavioral Health Resources in the Community: Intensive Outpatient Programs Organization         Address  Phone  Notes  [place]. [address], [place], [place] [phone]   [facility] Outpatient [address], [place], [place]   ADS: Alcohol & Drug S[facility]cs [address] [place], [place]   [place] [address]  [place], [place] or [phone]   Substance Abuse Resources Organization         Address  Phone  Notes  Alcohol and Drug Services  [phone]   [place]  [phone]   [facility] [place]   Chinita Pester  [phone]   Residential & Outpatient Substance Abuse Program  [phone]   Psychological Services Organization         Address  Phone  Notes  [facility] [place]  [place]  [phone]   [place] [address], [place] [phone] or [phone]    Mobile Crisis  Teams Organization         Address  Phone  Notes  [facility], Mobile Crisis Care Unit  [phone]   [facility] Psychotherapeutic Services  [address] [place], [place]   [place] Levels [address], [place] [phone]    Self-Help/Support Groups Organization         Address  Phone             Notes  [facility] of [place] - variety of support groups  [place] Call for more information  Narcotics Anonymous (NA), [facility] [address], [employer] [place]  2 meetings at this location   Special educational needs teacher         Address  Phone  Notes  [facility]AP Residential Treatment [place],    [place]  [phone]   [facility]  [address], [place] [id number], [place], [place]   [place], [place] [phone] Admissions: 8am-3pm M-F  Incentives Substance [place] [address]    [place], [place] [phone]   [facility] [address] [place], [place], [place]   [facility] [address],  [place], [place]   Insight Programs - Intensive Outpatient [address], [place], [place]   [facility] ([place].,  [place], [place] [phone] or [phone]   [facility]idential Treatment Services (RTS) [address] [place], [place] Accepts Medicaid  [facility] [place] [address]  [place] [phone] Substance Abuse/Addiction Treatment   [facility] Organization         Address  Phone  Notes  [facility]  [phone]   Domenic Schwab, PhD [address] [place], [place]   [phone] or [phone]   [place], [place] [phone]   [facility] [address] [place], [place] [phone]  Insurance/Medicaid/sponsorship through [facility] and Families [address] [place]                                    [place], [place] [phone] [place] [address], [place] [phone]    Dr. Adele Schilder  [phone]   [facility] Dept. 1) [address], [place] 2) [place] 3)  [place] [address], [place] [phone]  [phone]   [place] [phone] or [phone] (After Hours)

## 2013-04-29 ENCOUNTER — Emergency Department (HOSPITAL_COMMUNITY)
Admission: EM | Admit: 2013-04-29 | Discharge: 2013-04-29 | Disposition: A | Discharge status: Home / Self Care | Payer: Self-pay | Attending: Emergency Medicine | Admitting: Emergency Medicine

## 2013-04-29 ENCOUNTER — Encounter (HOSPITAL_COMMUNITY): Payer: Self-pay | Admitting: Emergency Medicine

## 2013-04-29 ENCOUNTER — Emergency Department (HOSPITAL_COMMUNITY)
Admission: EM | Admit: 2013-04-29 | Discharge: 2013-04-29 | Discharge status: Against Medical Advice | Payer: Self-pay | Attending: Emergency Medicine | Admitting: Emergency Medicine

## 2013-04-29 DIAGNOSIS — Z765 Malingerer [conscious simulation]: Secondary | ICD-10-CM | POA: Insufficient documentation

## 2013-04-29 DIAGNOSIS — Z8742 Personal history of other diseases of the female genital tract: Secondary | ICD-10-CM | POA: Insufficient documentation

## 2013-04-29 DIAGNOSIS — Z79899 Other long term (current) drug therapy: Secondary | ICD-10-CM | POA: Insufficient documentation

## 2013-04-29 DIAGNOSIS — F17200 Nicotine dependence, unspecified, uncomplicated: Secondary | ICD-10-CM | POA: Insufficient documentation

## 2013-04-29 DIAGNOSIS — L0390 Cellulitis, unspecified: Secondary | ICD-10-CM

## 2013-04-29 DIAGNOSIS — IMO0002 Reserved for concepts with insufficient information to code with codable children: Secondary | ICD-10-CM | POA: Insufficient documentation

## 2013-04-29 DIAGNOSIS — Z792 Long term (current) use of antibiotics: Secondary | ICD-10-CM | POA: Insufficient documentation

## 2013-04-29 DIAGNOSIS — L03119 Cellulitis of unspecified part of limb: Secondary | ICD-10-CM

## 2013-04-29 DIAGNOSIS — Z9119 Patient's noncompliance with other medical treatment and regimen: Secondary | ICD-10-CM | POA: Insufficient documentation

## 2013-04-29 DIAGNOSIS — Z91199 Patient's noncompliance with other medical treatment and regimen due to unspecified reason: Secondary | ICD-10-CM | POA: Insufficient documentation

## 2013-04-29 DIAGNOSIS — R509 Fever, unspecified: Secondary | ICD-10-CM | POA: Insufficient documentation

## 2013-04-29 DIAGNOSIS — L02419 Cutaneous abscess of limb, unspecified: Secondary | ICD-10-CM

## 2013-04-29 DIAGNOSIS — Z9114 Patient's other noncompliance with medication regimen: Secondary | ICD-10-CM

## 2013-04-29 DIAGNOSIS — R21 Rash and other nonspecific skin eruption: Secondary | ICD-10-CM | POA: Insufficient documentation

## 2013-04-29 DIAGNOSIS — L0291 Cutaneous abscess, unspecified: Secondary | ICD-10-CM

## 2013-04-29 MED ORDER — TRAMADOL HCL 50 MG PO TABS: 100.0000 mg | ORAL_TABLET | Freq: Once | ORAL | Status: DC

## 2013-04-29 NOTE — ED Provider Notes (Signed)
CSN: [id number]     Arrival date & time 04/29/13  1320 History   First MD Initiated Contact with Patient 04/29/13 1328     Chief Complaint  Patient presents with  . Elbow Pain  . Cellulitis     (Consider location/radiation/quality/duration/timing/severity/associated sxs/prior Treatment) HPI Comments: Patient presents to the ER for evaluation of pain and swelling of the left elbow. Patient reports that symptoms began 3 days ago as a small bump in the area. She was seen 2 days ago and told she had cellulitis. She was prescribed Keflex and Septra, but did not fill these prescriptions. Since then the area has extended, now more painful, swollen, redness has enlarged. There has not been any drainage.   Past Medical History  Diagnosis Date  . No pertinent past medical history   . Cervical cyst    Past Surgical History  Procedure Laterality Date  . No past surgeries    . Dilation and curettage of uterus      TAB    Family History  Problem Relation Age of Onset  . Other Neg Hx   . Stroke Mother   . Cancer Maternal Grandmother    History  Substance Use Topics  . Smoking status: Current Every Day Smoker -- 0.25 packs/day for 8 years    Types: Cigarettes  . Smokeless tobacco: Never Used  . Alcohol Use: Yes     Comment: occ   OB History   Grav Para Term Preterm Abortions TAB SAB Ect Mult Living   6 1 1  0 5 1 4  0 0 1     Review of Systems  Constitutional: Positive for fever.  Skin: Positive for color change and rash.  All other systems reviewed and are negative.     Allergies  Coconut fatty acids; Acetaminophen; and Vicodin  Home Medications   Current Outpatient Rx  Name  Route  Sig  Dispense  Refill  . cephALEXin (KEFLEX) 500 MG capsule   Oral   Take 1 capsule (500 mg total) by mouth 4 (four) times daily.   40 capsule   0   . HYDROcodone-acetaminophen (NORCO/VICODIN) 5-325 MG per tablet   Oral   Take 1 tablet by mouth every 4 (four) hours as needed.   10  tablet   0   . ondansetron (ZOFRAN) 4 MG tablet   Oral   Take 1 tablet (4 mg total) by mouth every 6 (six) hours.   12 tablet   0   . sulfamethoxazole-trimethoprim (SEPTRA DS) 800-160 MG per tablet   Oral   Take 1 tablet by mouth 2 (two) times daily.   20 tablet   0    BP 125/70  Pulse 87  Temp(Src) 98.4 F (36.9 C) (Oral)  Resp 20  SpO2 100%  LMP 03/12/2013 Physical Exam  Constitutional: She is oriented to person, place, and time. She appears well-developed and well-nourished. No distress.  HENT:  Head: Normocephalic and atraumatic.  Right Ear: Hearing normal.  Left Ear: Hearing normal.  Nose: Nose normal.  Mouth/Throat: Oropharynx is clear and moist and mucous membranes are normal.  Eyes: Conjunctivae and EOM are normal. Pupils are equal, round, and reactive to light.  Neck: Normal range of motion. Neck supple.  Cardiovascular: Regular rhythm, S1 normal and S2 normal.  Exam reveals no gallop and no friction rub.   No murmur heard. Pulmonary/Chest: Effort normal and breath sounds normal. No respiratory distress. She exhibits no tenderness.  Abdominal: Soft. Normal appearance and bowel  sounds are normal. There is no hepatosplenomegaly. There is no tenderness. There is no rebound, no guarding, no tenderness at McBurney's point and negative Murphy's sign. No hernia.  Musculoskeletal: Normal range of motion.  Neurological: She is alert and oriented to person, place, and time. She has normal strength. No cranial nerve deficit or sensory deficit. Coordination normal. GCS eye subscore is 4. GCS verbal subscore is 5. GCS motor subscore is 6.  Skin: Skin is warm, dry and intact. No rash noted. No cyanosis.     Psychiatric: She has a normal mood and affect. Her speech is normal and behavior is normal. Thought content normal.    ED Course  Procedures (including critical care time)  Attempted incision and drainage: Area was sterilely prepped and cleaned with Betadine. A 25-gauge  needle was inserted into the fluctuant region and 2% lidocaine was infiltrated. Patient only tolerated injection for less than 1 second (~0.625mL), before she terminated the procedure. No incision or drainage attempted.  Labs Review Labs Reviewed - No data to display Imaging Review No results found.   EKG Interpretation None      MDM   Final diagnoses:  Cellulitis  Abscess  Noncompliance with medication regimen  Drug-seeking behavior    Patient presents to the ER for evaluation of worsening redness of the left elbow. She was seen 2 days ago and diagnosed with cellulitis. She did not fill the prescriptions but did apparently fill the pain medication. Area has now worsened. When I discussed the need for antibiotics with her she said antibiotics make her nauseated and vomit, she cannot take them. I informed her that the only way she would get better is with antibiotics, and she was informed that she could lose her arm if she does not take the treatment prescribed.  Patient claims allergy to Vicodin. I ordered Ultram prior to the drainage of the abscess, patient had claimed to be allergic to the Ultram. Records however allergic to Tylenol as well. She now claims that she is not allergic to Tylenol. This is concerning behavior.  There is some fluctuance to the medial aspect of the region. I am concerned for the possibility of early abscess formation and I recommended incision and drainage.  Patient initially agreed to the procedure. I did attempt to inject the area for local infiltration, however, patient began to yell and curse. She could not tolerate the injection and the procedure was terminated. She began to demand pain medication. She became abusive and refused to attempt any further procedure. She was belligerent, refused to stay in the ER and refused discharge instructions.    Gilda Creasehristopher J. Chianne Byrns, MD 04/29/13 [phone]
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
## 2013-04-29 NOTE — ED Notes (Addendum)
Pt c/o right elbow pain redness and swelling; pt sts seen for same and given rx for antibiotics that she did not fill and now is worse; pt sts some fevers

## 2013-04-29 NOTE — Discharge Instructions (Signed)
Take previously prescribed medications as directed. Follow up with [place] urgent care in 2 days for wound check.

## 2013-04-29 NOTE — ED Notes (Signed)
Patient said she did not want to sign anything, she advised she did not have to sign anything.  The patient was upset and cussing saying she did not need to be treated like this.  She said she was going straight to [facility] to be seen.  I advised her to fill her antibiotic prescription she received when she was seen and discharged from here.  She said she was not going to do that, she said she was going straight to [facility].  The patient refused to sign the AMA papers.

## 2013-04-29 NOTE — ED Provider Notes (Signed)
Medical screening examination/treatment/procedure(s) were performed by non-physician practitioner and as supervising physician I was immediately available for consultation/collaboration.   EKG Interpretation None       Souleymane Saiki R. Avery Eustice, MD 04/29/13 2355 

## 2013-05-01 NOTE — ED Provider Notes (Signed)
Medical screening examination/treatment/procedure(s) were conducted as a shared visit with non-physician practitioner(s) or resident  and myself.  I personally evaluated the patient during the encounter and agree with the findings and plan unless otherwise indicated.    I have personally reviewed any xrays and/ or EKG's with the provider and I agree with interpretation.   EMERGENCY DEPARTMENT US SOFT TISSUE INTERPRETATION "Study: Limited Soft Tissue Ultrasound"  INDICATIONS: Pain and Soft tissue infection Multiple views of the body part were obtained in real-time with a multi-frequency linear probe PERFORMED BY:  Myself IMAGES ARCHIVED?: Yes SIDE:Left BODY PART:Upper extremity FINDINGS: No abcess noted and Cellulitis present INTERPRETATION:  No abcess noted and Cellulitis present  Worsening pain, swelling and erythema to left lateral elbow.  No known bites or injuries.  No skin infections in the past.  No abscess on US. Early elbow bursitis vs cellulitis. Abx and close fup outpt, reasons to return given.  Cellulitis   Enid SkeensJoshua M Armonie Mettler, MD 05/01/13 [phone]

## 2013-09-21 ENCOUNTER — Encounter (HOSPITAL_COMMUNITY): Payer: Self-pay | Admitting: Emergency Medicine

## 2013-09-21 ENCOUNTER — Emergency Department (HOSPITAL_COMMUNITY): Payer: Self-pay

## 2013-09-21 ENCOUNTER — Emergency Department (HOSPITAL_COMMUNITY)
Admission: EM | Admit: 2013-09-21 | Discharge: 2013-09-22 | Disposition: A | Discharge status: Home / Self Care | Payer: Self-pay | Attending: Emergency Medicine | Admitting: Emergency Medicine

## 2013-09-21 DIAGNOSIS — J069 Acute upper respiratory infection, unspecified: Secondary | ICD-10-CM | POA: Insufficient documentation

## 2013-09-21 DIAGNOSIS — Z8742 Personal history of other diseases of the female genital tract: Secondary | ICD-10-CM | POA: Insufficient documentation

## 2013-09-21 DIAGNOSIS — R0789 Other chest pain: Secondary | ICD-10-CM

## 2013-09-21 DIAGNOSIS — F17200 Nicotine dependence, unspecified, uncomplicated: Secondary | ICD-10-CM | POA: Insufficient documentation

## 2013-09-21 DIAGNOSIS — B9789 Other viral agents as the cause of diseases classified elsewhere: Secondary | ICD-10-CM

## 2013-09-21 DIAGNOSIS — R05 Cough: Secondary | ICD-10-CM | POA: Insufficient documentation

## 2013-09-21 DIAGNOSIS — Z79899 Other long term (current) drug therapy: Secondary | ICD-10-CM | POA: Insufficient documentation

## 2013-09-21 DIAGNOSIS — R0602 Shortness of breath: Secondary | ICD-10-CM | POA: Insufficient documentation

## 2013-09-21 DIAGNOSIS — R071 Chest pain on breathing: Secondary | ICD-10-CM | POA: Insufficient documentation

## 2013-09-21 DIAGNOSIS — R059 Cough, unspecified: Secondary | ICD-10-CM | POA: Insufficient documentation

## 2013-09-21 LAB — RAPID STREP SCREEN (MED CTR MEBANE ONLY): Streptococcus, Group A Screen (Direct): NEGATIVE

## 2013-09-21 MED ORDER — OXYCODONE HCL 5 MG PO TABS
5.0000 mg | ORAL_TABLET | Freq: Once | ORAL | Status: AC
  Administered 2013-09-21: 5 mg via ORAL
  Filled 2013-09-21: qty 1

## 2013-09-21 MED ORDER — AEROCHAMBER PLUS W/MASK MISC
1.0000 | Freq: Once | Status: AC
  Administered 2013-09-22: 1
  Filled 2013-09-21: qty 1

## 2013-09-21 MED ORDER — BENZONATATE 100 MG PO CAPS
200.0000 mg | ORAL_CAPSULE | Freq: Once | ORAL | Status: AC
  Administered 2013-09-21: 200 mg via ORAL
  Filled 2013-09-21: qty 2

## 2013-09-21 MED ORDER — HYDROCODONE-HOMATROPINE 5-1.5 MG/5ML PO SYRP: 5.0000 mL | ORAL_SOLUTION | Freq: Four times a day (QID) | ORAL | Status: DC | PRN

## 2013-09-21 MED ORDER — FLUTICASONE PROPIONATE 50 MCG/ACT NA SUSP: 2.0000 | Freq: Every day | NASAL | Status: DC

## 2013-09-21 MED ORDER — ALBUTEROL SULFATE HFA 108 (90 BASE) MCG/ACT IN AERS
2.0000 | INHALATION_SPRAY | RESPIRATORY_TRACT | Status: DC | PRN
  Administered 2013-09-21: 2 via RESPIRATORY_TRACT
  Filled 2013-09-21: qty 6.7

## 2013-09-21 MED ORDER — IBUPROFEN 800 MG PO TABS
800.0000 mg | ORAL_TABLET | Freq: Once | ORAL | Status: AC
  Administered 2013-09-21: 800 mg via ORAL
  Filled 2013-09-21: qty 1

## 2013-09-21 NOTE — ED Notes (Signed)
Muthersbaugh, PA at bedside.

## 2013-09-21 NOTE — ED Notes (Signed)
Pt is reporting SOB, however she is talking and laughing with her visitor. Pt is able to speak in full sentences.

## 2013-09-21 NOTE — Discharge Instructions (Signed)
1. Medications: hycodan, albuterol, flonase, usual home medications 2. Treatment: rest, drink plenty of fluids,  3. Follow Up: Please followup with your primary doctor in 3 days for discussion of your diagnoses and further evaluation after today's visit; if you do not have a primary care doctor use the resource guide provided to find one;     Chest Wall Pain Chest wall pain is pain in or around the bones and muscles of your chest. It may take up to 6 weeks to get better. It may take longer if you must stay physically active in your work and activities.  CAUSES  Chest wall pain may happen on its own. However, it may be caused by:  A viral illness like the flu.  Injury.  Coughing.  Exercise.  Arthritis.  Fibromyalgia.  Shingles. HOME CARE INSTRUCTIONS   Avoid overtiring physical activity. Try not to strain or perform activities that cause pain. This includes any activities using your chest or your abdominal and side muscles, especially if heavy weights are used.  Put ice on the sore area.  Put ice in a plastic bag.  Place a towel between your skin and the bag.  Leave the ice on for 15-20 minutes per hour while awake for the first 2 days.  Only take over-the-counter or prescription medicines for pain, discomfort, or fever as directed by your caregiver. SEEK IMMEDIATE MEDICAL CARE IF:   Your pain increases, or you are very uncomfortable.  You have a fever.  Your chest pain becomes worse.  You have new, unexplained symptoms.  You have nausea or vomiting.  You feel sweaty or lightheaded.  You have a cough with phlegm (sputum), or you cough up blood. MAKE SURE YOU:   Understand these instructions.  Will watch your condition.  Will get help right away if you are not doing well or get worse. Document Released: 01/03/2005 Document Revised: 03/28/2011 Document Reviewed: 08/30/2010 [facility] Patient Information 2015 [place], [place]. This information is not intended to  replace advice given to you by your health care provider. Make sure you discuss any questions you have with your health care provider.   Upper Respiratory Infection, Adult An upper respiratory infection (URI) is also known as the common cold. It is often caused by a type of germ (virus). Colds are easily spread (contagious). You can pass it to others by kissing, coughing, sneezing, or drinking out of the same glass. Usually, you get better in 1 or 2 weeks.  HOME CARE   Only take medicine as told by your doctor.  Use a warm mist humidifier or breathe in steam from a hot shower.  Drink enough water and fluids to keep your pee (urine) clear or pale yellow.  Get plenty of rest.  Return to work when your temperature is back to normal or as told by your doctor. You may use a face mask and wash your hands to stop your cold from spreading. GET HELP RIGHT AWAY IF:   After the first few days, you feel you are getting worse.  You have questions about your medicine.  You have chills, shortness of breath, or brown or red spit (mucus).  You have yellow or brown snot (nasal discharge) or pain in the face, especially when you bend forward.  You have a fever, puffy (swollen) neck, pain when you swallow, or white spots in the back of your throat.  You have a bad headache, ear pain, sinus pain, or chest pain.  You have a high-pitched  whistling sound when you breathe in and out (wheezing).  You have a lasting cough or cough up blood.  You have sore muscles or a stiff neck. MAKE SURE YOU:   Understand these instructions.  Will watch your condition.  Will get help right away if you are not doing well or get worse. Document Released: 06/22/2007 Document Revised: 03/28/2011 Document Reviewed: 04/10/2013 [facility] Patient Information 2015 [place], [place]. This information is not intended to replace advice given to you by your health care provider. Make sure you discuss any questions you have with  your health care provider.

## 2013-09-21 NOTE — ED Provider Notes (Signed)
CSN: [id number]     Arrival date & time 09/21/13  2131 History   First MD Initiated Contact with Patient 09/21/13 2331     This chart was scribed for non-physician practitioner, Dierdre Forth PA-C working with Loren Racer, MD by Arlan Organ, ED Scribe. This patient was seen in room D32C/D32C and the patient's care was started at 12:10 AM.   Chief Complaint  Patient presents with  . Shortness of Breath   The history is provided by the patient. No language interpreter was used.    HPI Comments: [name] [name] is a 23 y.o. female who presents to the Emergency Department complaining of constant, moderate SOB x 2 weeks. Pt also reports constant non-productive cough, chest soreness, sore throat, L sided otalgia, and HA. She also mentions a fever of 101, 2 days ago that has now resolved. At this time pt states she is unable to eat due to intermittent post tussive emesis.  She denies any recent sick contacts. She has tried OTC Ibuprofen without any improvement for symptoms. No other concerns this visit.  Past Medical History  Diagnosis Date  . No pertinent past medical history   . Cervical cyst    Past Surgical History  Procedure Laterality Date  . No past surgeries    . Dilation and curettage of uterus      TAB    Family History  Problem Relation Age of Onset  . Other Neg Hx   . Stroke Mother   . Cancer Maternal Grandmother    History  Substance Use Topics  . Smoking status: Current Every Day Smoker -- 0.25 packs/day for 8 years    Types: Cigarettes  . Smokeless tobacco: Never Used  . Alcohol Use: Yes     Comment: occ   OB History   Grav Para Term Preterm Abortions TAB SAB Ect Mult Living   0 0 0 1     Review of Systems  Constitutional: Positive for fever, activity change and appetite change. Negative for diaphoresis, fatigue and unexpected weight change.  HENT: Positive for ear pain, postnasal drip, rhinorrhea, sinus pressure and sore throat. Negative for  mouth sores.   Eyes: Negative for visual disturbance.  Respiratory: Positive for cough, chest tightness and shortness of breath. Negative for wheezing.   Cardiovascular: Negative for chest pain.  Gastrointestinal: Negative for nausea, vomiting, abdominal pain, diarrhea and constipation.  Endocrine: Negative for polydipsia, polyphagia and polyuria.  Genitourinary: Negative for dysuria, urgency, frequency and hematuria.  Musculoskeletal: Positive for back pain. Negative for neck stiffness.  Skin: Negative for rash.  Allergic/Immunologic: Negative for immunocompromised state.  Neurological: Positive for headaches. Negative for syncope and light-headedness.  Hematological: Does not bruise/bleed easily.  Psychiatric/Behavioral: Negative for sleep disturbance. The patient is not nervous/anxious.       Allergies  Coconut fatty acids; Acetaminophen; and Vicodin  Home Medications   Prior to Admission medications   Medication Sig Start Date End Date Taking? Authorizing Provider  Fexofenadine HCl (MUCINEX ALLERGY PO) Take 2 tablets by mouth 2 (two) times daily as needed (congestion).   Yes Historical Provider, MD  fluticasone (FLONASE) 50 MCG/ACT nasal spray Place 2 sprays into both nostrils daily. 09/21/13   Chaney Maclaren, PA-C  HYDROcodone-homatropine (HYCODAN) 5-1.5 MG/5ML syrup Take 5 mLs by mouth every 6 (six) hours as needed for cough. 09/21/13   Nashley Cordoba, PA-C   Triage Vitals: BP 120/73  Pulse 75  Temp(Src) 99.3 F (37.4 C) (Oral)  Resp 16  Ht  (1.651 m)  Wt 152 lb (68.947 kg)  BMI 25.29 kg/m2  SpO2 100%  LMP 09/21/2013   Physical Exam  Nursing note and vitals reviewed. Constitutional: She is oriented to person, place, and time. She appears well-developed and well-nourished. No distress.  Awake, alert, nontoxic appearance  HENT:  Head: Normocephalic and atraumatic.  Right Ear: Tympanic membrane, external ear and ear canal normal.  Left Ear: Tympanic  membrane, external ear and ear canal normal.  Nose: Mucosal edema and rhinorrhea present. No epistaxis. Right sinus exhibits no maxillary sinus tenderness and no frontal sinus tenderness. Left sinus exhibits no maxillary sinus tenderness and no frontal sinus tenderness.  Mouth/Throat: Uvula is midline and mucous membranes are normal. Mucous membranes are not pale and not cyanotic. Posterior oropharyngeal edema present. No oropharyngeal exudate, posterior oropharyngeal erythema or tonsillar abscesses.  Mild swelling of the bilateral tonsils  Eyes: Conjunctivae are normal. Pupils are equal, round, and reactive to light. No scleral icterus.  Neck: Normal range of motion and full passive range of motion without pain. Neck supple.  Cardiovascular: Normal rate, regular rhythm, normal heart sounds and intact distal pulses.   No murmur heard. Pulmonary/Chest: Effort normal. No accessory muscle usage or stridor. Not tachypneic. No respiratory distress. She has decreased breath sounds (slightly, throughout). She has no wheezes. She has no rhonchi. She has no rales.  Equal chest expansion No focal wheezes, Rales or rhonchi Slightly diminished breath sounds throughout  Abdominal: Soft. Bowel sounds are normal. She exhibits no mass. There is no tenderness. There is no rebound and no guarding.  Musculoskeletal: Normal range of motion. She exhibits no edema.  Lymphadenopathy:    She has no cervical adenopathy.  Neurological: She is alert and oriented to person, place, and time.  Speech is clear and goal oriented Moves extremities without ataxia  Skin: Skin is warm and dry. No rash noted. She is not diaphoretic. No erythema.  Psychiatric: She has a normal mood and affect.    ED Course  Procedures (including critical care time)  DIAGNOSTIC STUDIES: Oxygen Saturation is 100% on RA, Normal by my interpretation.    COORDINATION OF CARE: 12:10 AM-Discussed treatment plan with pt at bedside and pt agreed to  plan.     Labs Review Labs Reviewed  RAPID STREP SCREEN  CULTURE, GROUP A STREP    Imaging Review Dg Chest 2 View  09/21/2013   CLINICAL DATA:  Shortness of breath  EXAM: CHEST  2 VIEW  COMPARISON:  10/09/2009  FINDINGS: The heart size and mediastinal contours are within normal limits. Both lungs are clear. The visualized skeletal structures are unremarkable.  IMPRESSION: No active cardiopulmonary disease.   Electronically Signed   By: Burman Nieves M.D.   On: 09/21/2013 23:26     EKG Interpretation None      MDM   Final diagnoses:  Viral URI with cough  Chest wall pain   [name] [name] presents with URI symptoms.  Pt CXR negative for acute infiltrate. Patients symptoms are consistent with URI, likely viral etiology. On physical exam patient with chest wall pain reproducible with palpation and movement. Discussed that antibiotics are not indicated for viral infections. Pt will be discharged with symptomatic treatment including cough suppression, albuterol for shortness of breath and Flonase for nasal congestion.Trenton Gammon understanding and is agreeable with plan. Pt is hemodynamically stable & in NAD prior to dc.  Patient is taking her phone, laughing with her friend and talking  in full senses.   She is also tolerating by mouth potato chips while in the department. Patient is to followup with her primary care physician within 3 days.  She is to return to the emergency department for worsening shortness of breath or high fevers.  I have personally reviewed patient's vitals, nursing note and any pertinent labs or imaging.  I performed an undressed physical exam.    At this time, it has been determined that no acute conditions requiring further emergency intervention. The patient/guardian have been advised of the diagnosis and plan. I reviewed all labs and imaging including any potential incidental findings.  Vital signs are stable at discharge.   BP 120/73  Pulse 75  Temp(Src) 99.3  F (37.4 C) (Oral)  Resp 16  Ht  (1.651 m)  Wt 152 lb (68.947 kg)  BMI 25.29 kg/m2  SpO2 100%  LMP 09/21/2013  I personally performed the services described in this documentation, which was scribed in my presence. The recorded information has been reviewed and is accurate.    Dahlia Client Chenise Mulvihill, PA-C 09/22/13 [phone]

## 2013-09-21 NOTE — ED Notes (Signed)
The pt has had a cold for 2 weeks with hoarseness sinus and chest congestion.  No temp.  Also has a sore throat and a headache from coughing.  Non-productive cough.  lmp  Now.  Unknown if she has had a temp

## 2013-09-22 NOTE — ED Provider Notes (Signed)
Medical screening examination/treatment/procedure(s) were performed by non-physician practitioner and as supervising physician I was immediately available for consultation/collaboration.   EKG Interpretation None        Boykin Baetz, MD 09/22/13 0655 

## 2013-09-24 LAB — CULTURE, GROUP A STREP

## 2013-11-18 ENCOUNTER — Encounter (HOSPITAL_COMMUNITY): Payer: Self-pay | Admitting: Emergency Medicine

## 2014-04-19 ENCOUNTER — Encounter (HOSPITAL_COMMUNITY): Payer: Self-pay | Admitting: *Deleted

## 2014-04-19 ENCOUNTER — Emergency Department (HOSPITAL_COMMUNITY): Payer: Self-pay

## 2014-04-19 ENCOUNTER — Emergency Department (HOSPITAL_COMMUNITY)
Admission: EM | Admit: 2014-04-19 | Discharge: 2014-04-19 | Disposition: A | Discharge status: Home / Self Care | Payer: Self-pay | Attending: Emergency Medicine | Admitting: Emergency Medicine

## 2014-04-19 DIAGNOSIS — S060X9A Concussion with loss of consciousness of unspecified duration, initial encounter: Secondary | ICD-10-CM | POA: Insufficient documentation

## 2014-04-19 DIAGNOSIS — S99911A Unspecified injury of right ankle, initial encounter: Secondary | ICD-10-CM | POA: Insufficient documentation

## 2014-04-19 DIAGNOSIS — W1789XA Other fall from one level to another, initial encounter: Secondary | ICD-10-CM | POA: Insufficient documentation

## 2014-04-19 DIAGNOSIS — W19XXXA Unspecified fall, initial encounter: Secondary | ICD-10-CM

## 2014-04-19 DIAGNOSIS — Z7951 Long term (current) use of inhaled steroids: Secondary | ICD-10-CM | POA: Insufficient documentation

## 2014-04-19 DIAGNOSIS — Y998 Other external cause status: Secondary | ICD-10-CM | POA: Insufficient documentation

## 2014-04-19 DIAGNOSIS — S098XXA Other specified injuries of head, initial encounter: Secondary | ICD-10-CM

## 2014-04-19 DIAGNOSIS — Z720 Tobacco use: Secondary | ICD-10-CM | POA: Insufficient documentation

## 2014-04-19 DIAGNOSIS — Y9289 Other specified places as the place of occurrence of the external cause: Secondary | ICD-10-CM | POA: Insufficient documentation

## 2014-04-19 DIAGNOSIS — Z3202 Encounter for pregnancy test, result negative: Secondary | ICD-10-CM | POA: Insufficient documentation

## 2014-04-19 DIAGNOSIS — Z23 Encounter for immunization: Secondary | ICD-10-CM | POA: Insufficient documentation

## 2014-04-19 DIAGNOSIS — Y9389 Activity, other specified: Secondary | ICD-10-CM | POA: Insufficient documentation

## 2014-04-19 DIAGNOSIS — S0101XA Laceration without foreign body of scalp, initial encounter: Secondary | ICD-10-CM | POA: Insufficient documentation

## 2014-04-19 DIAGNOSIS — Z8742 Personal history of other diseases of the female genital tract: Secondary | ICD-10-CM | POA: Insufficient documentation

## 2014-04-19 DIAGNOSIS — S199XXA Unspecified injury of neck, initial encounter: Secondary | ICD-10-CM | POA: Insufficient documentation

## 2014-04-19 LAB — COMPREHENSIVE METABOLIC PANEL
ALBUMIN: 3.8 g/dL (ref 3.5–5.2)
ALK PHOS: 43 U/L (ref 39–117)
ALT: 12 U/L (ref 0–35)
AST: 22 U/L (ref 0–37)
Anion gap: 8 (ref 5–15)
BILIRUBIN TOTAL: 0.2 mg/dL — AB (ref 0.3–1.2)
BUN: 11 mg/dL (ref 6–23)
CALCIUM: 9 mg/dL (ref 8.4–10.5)
CO2: 22 mmol/L (ref 19–32)
CREATININE: 0.98 mg/dL (ref 0.50–1.10)
Chloride: 110 mmol/L (ref 96–112)
GFR, EST NON AFRICAN AMERICAN: 79 mL/min — AB (ref >90)
Glucose, Bld: 79 mg/dL (ref 70–99)
Potassium: 3.6 mmol/L (ref 3.5–5.1)
SODIUM: 140 mmol/L (ref 135–145)
Total Protein: 7.2 g/dL (ref 6.0–8.3)

## 2014-04-19 LAB — CBC
HEMATOCRIT: 31.3 % — AB (ref 36.0–46.0)
Hemoglobin: 9.6 g/dL — ABNORMAL LOW (ref 12.0–15.0)
MCH: 24.2 pg — AB (ref 26.0–34.0)
MCHC: 30.7 g/dL (ref 30.0–36.0)
MCV: 78.8 fL (ref 78.0–100.0)
PLATELETS: 401 10*3/uL — AB (ref 150–400)
RBC: 3.97 MIL/uL (ref 3.87–5.11)
RDW: 17.2 % — ABNORMAL HIGH (ref 11.5–15.5)
WBC: 11.4 10*3/uL — AB (ref 4.0–10.5)

## 2014-04-19 LAB — SAMPLE TO BLOOD BANK

## 2014-04-19 LAB — PROTIME-INR
INR: 1.01 (ref 0.00–1.49)
PROTHROMBIN TIME: 13.4 s (ref 11.6–15.2)

## 2014-04-19 LAB — ETHANOL: Alcohol, Ethyl (B): 110 mg/dL — ABNORMAL HIGH (ref 0–9)

## 2014-04-19 LAB — HCG, QUANTITATIVE, PREGNANCY

## 2014-04-19 MED ORDER — TETANUS-DIPHTH-ACELL PERTUSSIS 5-2.5-18.5 LF-MCG/0.5 IM SUSP
0.5000 mL | Freq: Once | INTRAMUSCULAR | Status: AC
  Administered 2014-04-19: 0.5 mL via INTRAMUSCULAR
  Filled 2014-04-19: qty 0.5

## 2014-04-19 MED ORDER — OXYCODONE-ACETAMINOPHEN 5-325 MG PO TABS: 1.0000 | ORAL_TABLET | Freq: Four times a day (QID) | ORAL | Status: DC | PRN

## 2014-04-19 MED ORDER — OXYCODONE-ACETAMINOPHEN 5-325 MG PO TABS
1.0000 | ORAL_TABLET | Freq: Once | ORAL | Status: AC
  Administered 2014-04-19: 1 via ORAL
  Filled 2014-04-19: qty 1

## 2014-04-19 MED ORDER — ONDANSETRON HCL 4 MG/2ML IJ SOLN
4.0000 mg | Freq: Once | INTRAMUSCULAR | Status: AC
  Administered 2014-04-19: 4 mg via INTRAVENOUS
  Filled 2014-04-19: qty 2

## 2014-04-19 MED ORDER — MORPHINE SULFATE 4 MG/ML IJ SOLN
4.0000 mg | Freq: Once | INTRAMUSCULAR | Status: AC
  Administered 2014-04-19: 4 mg via INTRAVENOUS
  Filled 2014-04-19: qty 1

## 2014-04-19 MED ORDER — IOHEXOL 300 MG/ML  SOLN
100.0000 mL | Freq: Once | INTRAMUSCULAR | Status: AC | PRN
  Administered 2014-04-19: 100 mL via INTRAVENOUS
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
## 2014-04-19 NOTE — ED Notes (Signed)
Ct called stating the patient is requesting something for pain.  Dr Wilkie AyeHorton aware.
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
## 2014-04-19 NOTE — ED Notes (Addendum)
At pt's request, this RN called her friend Edmonia CaprioYola, at [phone], and told her that the pt wanted her to come and see her. Edmonia CaprioYola states that she is on the way.

## 2014-04-19 NOTE — ED Notes (Signed)
Patient transported to CT 
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
## 2014-04-19 NOTE — ED Notes (Signed)
Patient returned from xray and CT  Placed on BP and pulse  ox

## 2014-04-19 NOTE — ED Notes (Signed)
Spoke with [name] [name], SW-- cab voucher given -- pt refused to wear C-Collar. Instructions given, no questions. Did not sign discharge.

## 2014-04-19 NOTE — ED Notes (Signed)
[facility]
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
## 2014-04-19 NOTE — ED Notes (Addendum)
Pt removed c-collar-- washing self-- moves all extremeties-- drinking water without difficulty. Ambulated to bathroom -- states head hurts- Requesting cab voucher or police ride home.

## 2018-06-06 ENCOUNTER — Other Ambulatory Visit: Payer: Self-pay

## 2018-06-06 ENCOUNTER — Emergency Department (HOSPITAL_COMMUNITY)
Admission: EM | Admit: 2018-06-06 | Discharge: 2018-06-06 | Disposition: A | Discharge status: Home / Self Care | Payer: Self-pay | Attending: Emergency Medicine | Admitting: Emergency Medicine

## 2018-06-06 ENCOUNTER — Encounter (HOSPITAL_COMMUNITY): Payer: Self-pay | Admitting: Emergency Medicine

## 2018-06-06 DIAGNOSIS — B9689 Other specified bacterial agents as the cause of diseases classified elsewhere: Secondary | ICD-10-CM | POA: Insufficient documentation

## 2018-06-06 DIAGNOSIS — R102 Pelvic and perineal pain: Secondary | ICD-10-CM | POA: Insufficient documentation

## 2018-06-06 DIAGNOSIS — F17210 Nicotine dependence, cigarettes, uncomplicated: Secondary | ICD-10-CM | POA: Insufficient documentation

## 2018-06-06 DIAGNOSIS — Z79899 Other long term (current) drug therapy: Secondary | ICD-10-CM | POA: Insufficient documentation

## 2018-06-06 DIAGNOSIS — F419 Anxiety disorder, unspecified: Secondary | ICD-10-CM | POA: Insufficient documentation

## 2018-06-06 DIAGNOSIS — N760 Acute vaginitis: Secondary | ICD-10-CM | POA: Insufficient documentation

## 2018-06-06 DIAGNOSIS — R1030 Lower abdominal pain, unspecified: Secondary | ICD-10-CM

## 2018-06-06 LAB — URINALYSIS, ROUTINE W REFLEX MICROSCOPIC
Bilirubin Urine: NEGATIVE
Glucose, UA: NEGATIVE mg/dL
Hgb urine dipstick: NEGATIVE
Ketones, ur: NEGATIVE mg/dL
Leukocytes,Ua: NEGATIVE
Nitrite: NEGATIVE
Protein, ur: NEGATIVE mg/dL
Specific Gravity, Urine: 1.009 (ref 1.005–1.030)
pH: 8 (ref 5.0–8.0)

## 2018-06-06 LAB — COMPREHENSIVE METABOLIC PANEL
ALT: 16 U/L (ref 0–44)
AST: 21 U/L (ref 15–41)
Albumin: 4 g/dL (ref 3.5–5.0)
Alkaline Phosphatase: 45 U/L (ref 38–126)
Anion gap: 8 (ref 5–15)
BUN: 8 mg/dL (ref 6–20)
CO2: 26 mmol/L (ref 22–32)
Calcium: 9.2 mg/dL (ref 8.9–10.3)
Chloride: 105 mmol/L (ref 98–111)
Creatinine, Ser: 0.67 mg/dL (ref 0.44–1.00)
GFR calc Af Amer: >60 mL/min (ref >60)
GFR calc non Af Amer: >60 mL/min (ref >60)
Glucose, Bld: 111 mg/dL — ABNORMAL HIGH (ref 70–99)
Potassium: 3.1 mmol/L — ABNORMAL LOW (ref 3.5–5.1)
Sodium: 139 mmol/L (ref 135–145)
Total Bilirubin: 0.6 mg/dL (ref 0.3–1.2)
Total Protein: 7.9 g/dL (ref 6.5–8.1)

## 2018-06-06 LAB — CBC
HCT: 26.2 % — ABNORMAL LOW (ref 36.0–46.0)
Hemoglobin: 7 g/dL — ABNORMAL LOW (ref 12.0–15.0)
MCH: 17.9 pg — ABNORMAL LOW (ref 26.0–34.0)
MCHC: 26.7 g/dL — ABNORMAL LOW (ref 30.0–36.0)
MCV: 66.8 fL — ABNORMAL LOW (ref 80.0–100.0)
Platelets: 515 10*3/uL — ABNORMAL HIGH (ref 150–400)
RBC: 3.92 MIL/uL (ref 3.87–5.11)
RDW: 19.5 % — ABNORMAL HIGH (ref 11.5–15.5)
WBC: 7.4 10*3/uL (ref 4.0–10.5)
nRBC: 0 % (ref 0.0–0.2)

## 2018-06-06 LAB — WET PREP, GENITAL
Sperm: NONE SEEN
Trich, Wet Prep: NONE SEEN
Yeast Wet Prep HPF POC: NONE SEEN

## 2018-06-06 LAB — LIPASE, BLOOD: Lipase: 21 U/L (ref 11–51)

## 2018-06-06 LAB — I-STAT BETA HCG BLOOD, ED (MC, WL, AP ONLY): I-stat hCG, quantitative: <5 m[IU]/mL (ref <5)

## 2018-06-06 MED ORDER — IBUPROFEN 800 MG PO TABS
800.0000 mg | ORAL_TABLET | Freq: Once | ORAL | Status: DC
  Filled 2018-06-06: qty 1

## 2018-06-06 MED ORDER — FLUCONAZOLE 150 MG PO TABS: 150.0000 mg | ORAL_TABLET | Freq: Every day | ORAL | 0 refills | Status: AC

## 2018-06-06 MED ORDER — METRONIDAZOLE 500 MG PO TABS: 500.0000 mg | ORAL_TABLET | Freq: Two times a day (BID) | ORAL | 0 refills | Status: DC

## 2018-06-06 MED ORDER — CEFTRIAXONE SODIUM 250 MG IJ SOLR
250.0000 mg | Freq: Once | INTRAMUSCULAR | Status: AC
  Administered 2018-06-06: 250 mg via INTRAMUSCULAR
  Filled 2018-06-06: qty 250

## 2018-06-06 MED ORDER — SODIUM CHLORIDE 0.9% FLUSH: 3.0000 mL | Freq: Once | INTRAVENOUS | Status: DC

## 2018-06-06 MED ORDER — AZITHROMYCIN 250 MG PO TABS
1000.0000 mg | ORAL_TABLET | Freq: Once | ORAL | Status: AC
  Administered 2018-06-06: 1000 mg via ORAL
  Filled 2018-06-06: qty 4

## 2018-06-06 NOTE — Discharge Instructions (Addendum)
No sexual activity until all test results are back.   Your swabs tested positive for Bacterial Vaginosis today.   We will call for any further positive results.   If you need additional STD testing, please follow up with the [facility].  Your blood work also showed that you're anemic.   Please follow up with PCP regarding the cause of this anemia.

## 2018-06-06 NOTE — ED Triage Notes (Addendum)
Patient c/o lower abdominal pain radiating to pelvis with intermittent N/V/D x1 week. Also reports white discharge and pain with urination.

## 2018-06-06 NOTE — ED Provider Notes (Signed)
[facility]-EMERGENCY DEPT Provider Note   CSN: [id number] Arrival date & time: 06/06/18  1930    History   Chief Complaint Chief Complaint  Patient presents with  . Abdominal Pain    HPI [name] [name] is a 28 y.o. female presenting to the Emergency Department stating intermittent lower abd pain x 2 weeks.  She describes the pain as sharp, intermittent, non-radiating w/out apparent aggrav/allev factors.    She states that she has also been experiencing foul smelling discharge and dysuria.  Had n/v x 1 a few days ago, but none in the last 24 hours.   She states she is sexually active with one partner and does not use protection.   She states she is concerned she has an STD.   Denies fever, cp, shob, diarrhea.   States her LMP was less than 1 month ago.   Pt is tearful about her situation because she is concerned her partner is cheating on her.      The history is provided by the patient.  Abdominal Pain  Pain location:  Suprapubic Pain quality: sharp and stabbing   Pain radiates to:  Does not radiate Pain severity:  Moderate Onset quality:  Sudden Duration:  2 weeks Timing:  Intermittent Progression:  Unchanged Chronicity:  New Context: recent sexual activity   Relieved by:  Nothing Worsened by:  Nothing Ineffective treatments:  None tried Associated symptoms: dysuria, nausea, vaginal discharge and vomiting   Associated symptoms: no chest pain, no chills, no fever and no shortness of breath     Past Medical History:  Diagnosis Date  . Cervical cyst   . No pertinent past medical history     Patient Active Problem List   Diagnosis Date Noted  . GBS (group B Streptococcus carrier), +RV culture, currently pregnant 12/21/2011  . History of substance abuse ([facility]) 12/21/2011  . Supervision of normal pregnancy 12/12/2011    Past Surgical History:  Procedure Laterality Date  . DILATION AND CURETTAGE OF UTERUS     TAB   . NO PAST SURGERIES       OB  History    Gravida  6   Para  1   Term  1   Preterm  0   AB  5   Living  1     SAB  4   TAB  1   Ectopic  0   Multiple  0   Live Births  1            Home Medications    Prior to Admission medications   Medication Sig Start Date End Date Taking? Authorizing Provider  Prenatal MV & Min w/FA-DHA (PRENATAL ADULT GUMMY/DHA/FA PO) Take 3 each by mouth daily.   Yes [provider]  fluticasone (FLONASE) 50 MCG/ACT nasal spray Place 2 sprays into both nostrils daily. Patient not taking: Reported on 06/06/2018 09/21/13   Muthersbaugh, Dahlia Client, PA-C  HYDROcodone-homatropine [facility]) 5-1.5 MG/5ML syrup Take 5 mLs by mouth every 6 (six) hours as needed for cough. Patient not taking: Reported on 06/06/2018 09/21/13   Muthersbaugh, Dahlia Client, PA-C  metroNIDAZOLE (FLAGYL) 500 MG tablet Take 1 tablet (500 mg total) by mouth 2 (two) times daily. 06/06/18   Kelleigh Skerritt K, PA-C  oxyCODONE-acetaminophen (PERCOCET/ROXICET) 5-325 MG per tablet Take 1 tablet by mouth every 6 (six) hours as needed for severe pain. Patient not taking: Reported on 06/06/2018 04/19/14   Horton, Mayer Masker, MD    Family History Family History  Problem Relation Age of Onset  . Stroke Mother   . Cancer Maternal Grandmother   . Other Neg Hx     Social History Social History   Tobacco Use  . Smoking status: Current Every Day Smoker    Packs/day: 0.25    Years: 8.00    Pack years: 2.00    Types: Cigarettes  . Smokeless tobacco: Never Used  Substance Use Topics  . Alcohol use: Yes    Comment: occ  . Drug use: Yes     Allergies   Coconut fatty acids; Acetaminophen; and Vicodin [hydrocodone-acetaminophen]   Review of Systems Review of Systems  Constitutional: Negative for activity change, chills and fever.  Respiratory: Negative for shortness of breath.   Cardiovascular: Negative for chest pain.  Gastrointestinal: Positive for abdominal pain, nausea and vomiting.  Genitourinary: Positive for  dysuria, pelvic pain and vaginal discharge.  Skin: Negative for color change.     Physical Exam Updated Vital Signs BP 129/76   Pulse (!) 105   Temp 98 F (36.7 C) (Oral)   Resp 18   LMP 04/29/2018 (Approximate)   SpO2 100%   Physical Exam Vitals signs and nursing note reviewed.  Constitutional:      General: She is in acute distress (pt tearful about social situation).     Appearance: Normal appearance. She is well-developed. She is not diaphoretic.  HENT:     Head: Normocephalic and atraumatic.  Eyes:     General:        Right eye: No discharge.        Left eye: No discharge.  Cardiovascular:     Rate and Rhythm: Normal rate and regular rhythm.     Heart sounds: Normal heart sounds.  Pulmonary:     Effort: Pulmonary effort is normal. No respiratory distress.     Breath sounds: No wheezing or rhonchi.  Abdominal:     General: Abdomen is flat. Bowel sounds are normal. There is no distension.     Palpations: Abdomen is soft.     Tenderness: There is abdominal tenderness in the suprapubic area. There is no guarding or rebound.  Genitourinary:    Vagina: Normal.     Cervix: Normal.     Uterus: Normal.      Adnexa: Right adnexa normal and left adnexa normal.  Skin:    General: Skin is warm and dry.  Neurological:     General: No focal deficit present.     Mental Status: She is alert and oriented to person, place, and time.     Coordination: Coordination normal.  Psychiatric:        Mood and Affect: Mood is anxious.        Behavior: Behavior normal.      ED Treatments / Results  Labs (all labs ordered are listed, but only abnormal results are displayed) Labs Reviewed  WET PREP, GENITAL - Abnormal; Notable for the following components:      Result Value   Clue Cells Wet Prep HPF POC PRESENT (*)    WBC, Wet Prep HPF POC MANY (*)    All other components within normal limits  COMPREHENSIVE METABOLIC PANEL - Abnormal; Notable for the following components:    Potassium 3.1 (*)    Glucose, Bld 111 (*)    All other components within normal limits  CBC - Abnormal; Notable for the following components:   Hemoglobin 7.0 (*)    HCT 26.2 (*)    MCV 66.8 (*)  MCH 17.9 (*)    MCHC 26.7 (*)    RDW 19.5 (*)    Platelets 515 (*)    All other components within normal limits  LIPASE, BLOOD  URINALYSIS, ROUTINE W REFLEX MICROSCOPIC  I-STAT BETA HCG BLOOD, ED ([facility], [facility], [facility] ONLY)  GC/CHLAMYDIA PROBE AMP ([place]) NOT AT [facility]    EKG None  Radiology No results found.  Procedures Procedures (including critical care time)  Medications Ordered in ED Medications  sodium chloride flush (NS) 0.9 % injection 3 mL (3 mLs Intravenous Not Given 06/06/18 2022)  ibuprofen (ADVIL) tablet 800 mg (800 mg Oral Refused 06/06/18 2054)  cefTRIAXone (ROCEPHIN) injection 250 mg (has no administration in time range)  azithromycin (ZITHROMAX) tablet 1,000 mg (has no administration in time range)     Initial Impression / Assessment and Plan / ED Course  I have reviewed the triage vital signs and the nursing notes.  Pertinent labs & imaging results that were available during my care of the patient were reviewed by me and considered in my medical decision making (see chart for details).   Noted BV infection on swab.   Otherwise, labs reassuring.  No leukocytosis or fever here.   Pts only pain is suprapubic on exam.  I have treated pt here for gonorrhea/chlamydia prophylactically.   Pt understands that if she needs additional STD testing that she will need to follow up with [facility].  Will send home with flagyl to cover BV.   She also understands not to have any sexual activity until all results are back.   We will call back with any positive results.   She also understands she will need to have all partners treated.     I also noted pts microcytic anemia.   She denies any associated s/s of GI bleeding or current vaginal bleeding.   Not currently in need of  transfusion.   She will also need to follow up with PCP for chronic anemia.   Final Clinical Impressions(s) / ED Diagnoses   Final diagnoses:  Lower abdominal pain  BV (bacterial vaginosis)    ED Discharge Orders         Ordered    metroNIDAZOLE (FLAGYL) 500 MG tablet  2 times daily     06/06/18 2122           Dalena Plantz K, PA-C 06/06/18 2143    Charlynne Pander, MD 06/06/18 2238

## 2018-06-07 LAB — GC/CHLAMYDIA PROBE AMP (~~LOC~~) NOT AT ARMC
Chlamydia: NEGATIVE
Neisseria Gonorrhea: NEGATIVE

## 2019-04-30 ENCOUNTER — Emergency Department (HOSPITAL_COMMUNITY): Payer: Self-pay

## 2019-04-30 ENCOUNTER — Encounter (HOSPITAL_COMMUNITY): Payer: Self-pay

## 2019-04-30 ENCOUNTER — Inpatient Hospital Stay (HOSPITAL_COMMUNITY)
Admission: EM | Admit: 2019-04-30 | Discharge: 2019-05-01 | DRG: 812 | Disposition: A | Discharge status: Home / Self Care | Payer: Self-pay | Attending: Family Medicine | Admitting: Family Medicine

## 2019-04-30 ENCOUNTER — Other Ambulatory Visit: Payer: Self-pay

## 2019-04-30 DIAGNOSIS — R002 Palpitations: Secondary | ICD-10-CM

## 2019-04-30 DIAGNOSIS — Z79899 Other long term (current) drug therapy: Secondary | ICD-10-CM

## 2019-04-30 DIAGNOSIS — Z823 Family history of stroke: Secondary | ICD-10-CM

## 2019-04-30 DIAGNOSIS — F17210 Nicotine dependence, cigarettes, uncomplicated: Secondary | ICD-10-CM | POA: Diagnosis present

## 2019-04-30 DIAGNOSIS — K029 Dental caries, unspecified: Secondary | ICD-10-CM | POA: Diagnosis present

## 2019-04-30 DIAGNOSIS — F1110 Opioid abuse, uncomplicated: Secondary | ICD-10-CM | POA: Diagnosis present

## 2019-04-30 DIAGNOSIS — Z809 Family history of malignant neoplasm, unspecified: Secondary | ICD-10-CM

## 2019-04-30 DIAGNOSIS — Z886 Allergy status to analgesic agent status: Secondary | ICD-10-CM

## 2019-04-30 DIAGNOSIS — Z91018 Allergy to other foods: Secondary | ICD-10-CM

## 2019-04-30 DIAGNOSIS — Z720 Tobacco use: Secondary | ICD-10-CM

## 2019-04-30 DIAGNOSIS — D62 Acute posthemorrhagic anemia: Principal | ICD-10-CM

## 2019-04-30 DIAGNOSIS — Z885 Allergy status to narcotic agent status: Secondary | ICD-10-CM

## 2019-04-30 DIAGNOSIS — K760 Fatty (change of) liver, not elsewhere classified: Secondary | ICD-10-CM | POA: Diagnosis present

## 2019-04-30 DIAGNOSIS — D649 Anemia, unspecified: Secondary | ICD-10-CM

## 2019-04-30 DIAGNOSIS — N920 Excessive and frequent menstruation with regular cycle: Secondary | ICD-10-CM | POA: Diagnosis present

## 2019-04-30 DIAGNOSIS — D72829 Elevated white blood cell count, unspecified: Secondary | ICD-10-CM

## 2019-04-30 DIAGNOSIS — Z20822 Contact with and (suspected) exposure to covid-19: Secondary | ICD-10-CM | POA: Diagnosis present

## 2019-04-30 HISTORY — DX: Excessive and frequent menstruation with regular cycle: N92.0

## 2019-04-30 LAB — COMPREHENSIVE METABOLIC PANEL
ALT: 20 U/L (ref 0–44)
AST: 27 U/L (ref 15–41)
Albumin: 3.5 g/dL (ref 3.5–5.0)
Alkaline Phosphatase: 42 U/L (ref 38–126)
Anion gap: 6 (ref 5–15)
BUN: 13 mg/dL (ref 6–20)
CO2: 25 mmol/L (ref 22–32)
Calcium: 8.6 mg/dL — ABNORMAL LOW (ref 8.9–10.3)
Chloride: 105 mmol/L (ref 98–111)
Creatinine, Ser: 0.55 mg/dL (ref 0.44–1.00)
GFR calc Af Amer: >60 mL/min (ref >60)
GFR calc non Af Amer: >60 mL/min (ref >60)
Glucose, Bld: 90 mg/dL (ref 70–99)
Potassium: 3.6 mmol/L (ref 3.5–5.1)
Sodium: 136 mmol/L (ref 135–145)
Total Bilirubin: 0.3 mg/dL (ref 0.3–1.2)
Total Protein: 6.8 g/dL (ref 6.5–8.1)

## 2019-04-30 LAB — CBC WITH DIFFERENTIAL/PLATELET
Abs Immature Granulocytes: 0.04 10*3/uL (ref 0.00–0.07)
Basophils Absolute: 0.1 10*3/uL (ref 0.0–0.1)
Basophils Relative: 1 %
Eosinophils Absolute: 0.2 10*3/uL (ref 0.0–0.5)
Eosinophils Relative: 2 %
HCT: 21.4 % — ABNORMAL LOW (ref 36.0–46.0)
Hemoglobin: 5.6 g/dL — CL (ref 12.0–15.0)
Immature Granulocytes: 0 %
Lymphocytes Relative: 28 %
Lymphs Abs: 3.3 10*3/uL (ref 0.7–4.0)
MCH: 17.3 pg — ABNORMAL LOW (ref 26.0–34.0)
MCHC: 26.2 g/dL — ABNORMAL LOW (ref 30.0–36.0)
MCV: 66.3 fL — ABNORMAL LOW (ref 80.0–100.0)
Monocytes Absolute: 0.9 10*3/uL (ref 0.1–1.0)
Monocytes Relative: 8 %
Neutro Abs: 7.1 10*3/uL (ref 1.7–7.7)
Neutrophils Relative %: 61 %
Platelets: 443 10*3/uL — ABNORMAL HIGH (ref 150–400)
RBC: 3.23 MIL/uL — ABNORMAL LOW (ref 3.87–5.11)
RDW: 20.8 % — ABNORMAL HIGH (ref 11.5–15.5)
WBC: 11.7 10*3/uL — ABNORMAL HIGH (ref 4.0–10.5)
nRBC: 0 % (ref 0.0–0.2)

## 2019-04-30 LAB — URINALYSIS, ROUTINE W REFLEX MICROSCOPIC
Bilirubin Urine: NEGATIVE
Glucose, UA: NEGATIVE mg/dL
Hgb urine dipstick: NEGATIVE
Ketones, ur: NEGATIVE mg/dL
Leukocytes,Ua: NEGATIVE
Nitrite: NEGATIVE
Protein, ur: NEGATIVE mg/dL
Specific Gravity, Urine: 1.016 (ref 1.005–1.030)
pH: 7 (ref 5.0–8.0)

## 2019-04-30 LAB — PREPARE RBC (CROSSMATCH)

## 2019-04-30 LAB — RAPID URINE DRUG SCREEN, HOSP PERFORMED
Amphetamines: NOT DETECTED
Barbiturates: NOT DETECTED
Benzodiazepines: NOT DETECTED
Cocaine: POSITIVE — AB
Opiates: POSITIVE — AB
Tetrahydrocannabinol: POSITIVE — AB

## 2019-04-30 LAB — RESPIRATORY PANEL BY RT PCR (FLU A&B, COVID)
Influenza A by PCR: NEGATIVE
Influenza B by PCR: NEGATIVE
SARS Coronavirus 2 by RT PCR: NEGATIVE

## 2019-04-30 LAB — RETICULOCYTES
Immature Retic Fract: 12.1 % (ref 2.3–15.9)
RBC.: 3.25 MIL/uL — ABNORMAL LOW (ref 3.87–5.11)
Retic Count, Absolute: 33.8 10*3/uL (ref 19.0–186.0)
Retic Ct Pct: 1 % (ref 0.4–3.1)

## 2019-04-30 LAB — IRON AND TIBC
Iron: 18 ug/dL — ABNORMAL LOW (ref 28–170)
Saturation Ratios: 3 % — ABNORMAL LOW (ref 10.4–31.8)
TIBC: 584 ug/dL — ABNORMAL HIGH (ref 250–450)
UIBC: 566 ug/dL

## 2019-04-30 LAB — FERRITIN: Ferritin: 2 ng/mL — ABNORMAL LOW (ref 11–307)

## 2019-04-30 LAB — I-STAT BETA HCG BLOOD, ED (MC, WL, AP ONLY): I-stat hCG, quantitative: <5 m[IU]/mL (ref <5)

## 2019-04-30 LAB — POC OCCULT BLOOD, ED: Fecal Occult Bld: NEGATIVE

## 2019-04-30 LAB — PROTIME-INR
INR: 1 (ref 0.8–1.2)
Prothrombin Time: 12.9 seconds (ref 11.4–15.2)

## 2019-04-30 LAB — MAGNESIUM: Magnesium: 1.7 mg/dL (ref 1.7–2.4)

## 2019-04-30 LAB — FOLATE: Folate: 11.7 ng/mL (ref >5.9)

## 2019-04-30 MED ORDER — SODIUM CHLORIDE 0.9% FLUSH
3.0000 mL | Freq: Two times a day (BID) | INTRAVENOUS | Status: DC
  Administered 2019-04-30 – 2019-05-01 (×2): 3 mL via INTRAVENOUS

## 2019-04-30 MED ORDER — PENICILLIN V POTASSIUM 250 MG PO TABS: 250.0000 mg | ORAL_TABLET | Freq: Four times a day (QID) | ORAL | 0 refills | Status: AC

## 2019-04-30 MED ORDER — SODIUM CHLORIDE 0.9 % IV SOLN
10.0000 mL/h | Freq: Once | INTRAVENOUS | Status: AC
  Administered 2019-04-30: 23:00:00 10 mL/h via INTRAVENOUS

## 2019-04-30 MED ORDER — LORAZEPAM 2 MG/ML IJ SOLN
1.0000 mg | Freq: Once | INTRAMUSCULAR | Status: AC
  Administered 2019-04-30: 19:00:00 1 mg via INTRAVENOUS
  Filled 2019-04-30: qty 1

## 2019-04-30 MED ORDER — NICOTINE 14 MG/24HR TD PT24
14.0000 mg | MEDICATED_PATCH | Freq: Every day | TRANSDERMAL | Status: DC | PRN
  Administered 2019-04-30: 23:00:00 14 mg via TRANSDERMAL
  Filled 2019-04-30: qty 1

## 2019-04-30 MED ORDER — MORPHINE SULFATE (PF) 2 MG/ML IV SOLN
2.0000 mg | INTRAVENOUS | Status: DC | PRN
  Administered 2019-04-30 – 2019-05-01 (×7): 2 mg via INTRAVENOUS
  Filled 2019-04-30 (×7): qty 1

## 2019-04-30 MED ORDER — ONDANSETRON HCL 4 MG/2ML IJ SOLN
4.0000 mg | Freq: Four times a day (QID) | INTRAMUSCULAR | Status: DC | PRN
  Filled 2019-04-30: qty 2

## 2019-04-30 NOTE — ED Triage Notes (Signed)
Pt arrived via [facility] from home CC General upper abd pain and  NVD X 1 day. Pt denies blood in emesis or stool. Per EMS A/OX4, ambulatory at baseline.    20G RAC 4 mg Zofran   Hx EMS could not provide any significant history at this time

## 2019-04-30 NOTE — ED Notes (Signed)
Pt provided gingerale and crackers.

## 2019-04-30 NOTE — ED Provider Notes (Addendum)
4:55 PM Care assumed from Dr. Rhunette Croft.  At time of transfer care, patient is awaiting results of diagnostic lab testing prior to likely p.o. challenge before discussing disposition.  Patient already had ultrasound showing no evidence of acute cholecystitis other than some fatty liver disease.  Plan of care is to reassess patient and if work-up is reassuring, discharge patient with prescription for nausea medication as well as a prescription for Pen-Vee K for dental infection.  6:39 PM Patient's laboratory testing returned and patient was found to have anemia of 5.6.  This is worse than last year at 7.0.  Patient reports that she had 2 menstrual cycles lasting 1 to this month that finished several days ago.  She reports has had anemia in the past in the setting menstrual cycles.  Given the abdominal discomfort nausea and vomiting, I am somewhat concerned that we need to rule out an occult GI bleed.  She denied it being dark, tarry, or bloody.  We will do a fecal occult test.  We will also order 1 unit of blood as I am concerned about symptomatic anemia now that she is reporting she has had near syncopal episodes as well as palpitations and heart racing sensation yesterday with lightheadedness.  Patient is very anxious and very concerned about all this.  Initially she was going to refuse work-up including Covid test which would be needed before admission and/or before EGD if her fecal occult was positive.  We agreed to give Ativan to help relax patient and then allow her to get the coronavirus test and receive blood for symptomatic anemia.  We will admit for symptomatic anemia and lightheadedness/near syncope with blood pressure in the 90s.    CRITICAL CARE Performed by: Canary Brim Montine Hight Total critical care time: 35 minutes Critical care time was exclusive of separately billable procedures and treating other patients. Critical care was necessary to treat or prevent imminent or life-threatening  deterioration. Critical care was time spent personally by me on the following activities: development of treatment plan with patient and/or surrogate as well as nursing, discussions with consultants, evaluation of patient's response to treatment, examination of patient, obtaining history from patient or surrogate, ordering and performing treatments and interventions, ordering and review of laboratory studies, ordering and review of radiographic studies, pulse oximetry and re-evaluation of patient's condition.    Darrio Bade, Canary Brim, MD 05/01/19 0045    Arlet Marter, Canary Brim, MD 05/01/19 [phone]

## 2019-04-30 NOTE — ED Notes (Addendum)
Patient again stating its unfair she cannot smoke a cigarette. Patient reminded she has been given a nicotine patch to help with her symptoms. Patient states "I am an adult I should be able to go smoke if I want to" patient continues to be tearful

## 2019-04-30 NOTE — ED Provider Notes (Signed)
[facility] DEPT Provider Note   CSN: [id number] Arrival date & time: 04/30/19  1357     History Chief Complaint  Patient presents with  . Emesis  . Diarrhea    [name] [name] is a 29 y.o. female.  HPI    29 year old female comes in a chief complaint of vomiting diarrhea.  Patient has history of substance abuse.  She reports pain to her left tooth and abdominal pain with nausea and vomiting.  She started having abdominal pain yesterday.  She has had about 3 episodes of emesis and 2 or 3 episodes of loose bowel movements.  Her abdominal pain is in the upper quadrants.  She denies any UTI-like symptoms, vaginal discharge, vaginal bleeding.  Patient's toothache is not new.  She has not followed up with dentist.  She denies any headaches, fevers, chills.   Past Medical History:  Diagnosis Date  . Cervical cyst   . No pertinent past medical history     Patient Active Problem List   Diagnosis Date Noted  . GBS (group B Streptococcus carrier), +RV culture, currently pregnant 12/21/2011  . History of substance abuse ([place]) 12/21/2011  . Supervision of normal pregnancy 12/12/2011    Past Surgical History:  Procedure Laterality Date  . DILATION AND CURETTAGE OF UTERUS     TAB   . NO PAST SURGERIES       OB History    Gravida  6   Para  1   Term  1   Preterm  0   AB  5   Living  1     SAB  4   TAB  1   Ectopic  0   Multiple  0   Live Births  1           Family History  Problem Relation Age of Onset  . Stroke Mother   . Cancer Maternal Grandmother   . Other Neg Hx     Social History   Tobacco Use  . Smoking status: Current Every Day Smoker    Packs/day: 0.25    Years: 8.00    Pack years: 2.00    Types: Cigarettes  . Smokeless tobacco: Never Used  Substance Use Topics  . Alcohol use: Yes    Comment: occ  . Drug use: Yes    Home Medications Prior to Admission medications   Medication Sig Start Date End Date  Taking? Authorizing Provider  fluticasone (FLONASE) 50 MCG/ACT nasal spray Place 2 sprays into both nostrils daily. Patient not taking: Reported on 06/06/2018 09/21/13   Muthersbaugh, Jarrett Soho, PA-C  HYDROcodone-homatropine [facility]) 5-1.5 MG/5ML syrup Take 5 mLs by mouth every 6 (six) hours as needed for cough. Patient not taking: Reported on 06/06/2018 09/21/13   Muthersbaugh, Jarrett Soho, PA-C  metroNIDAZOLE (FLAGYL) 500 MG tablet Take 1 tablet (500 mg total) by mouth 2 (two) times daily. Patient not taking: Reported on 04/30/2019 06/06/18   Nodal, Amber K, PA-C  oxyCODONE-acetaminophen (PERCOCET/ROXICET) 5-325 MG per tablet Take 1 tablet by mouth every 6 (six) hours as needed for severe pain. Patient not taking: Reported on 06/06/2018 04/19/14   Horton, Barbette Hair, MD  penicillin v potassium (VEETID) 250 MG tablet Take 1 tablet (250 mg total) by mouth 4 (four) times daily for 7 days. 04/30/19 05/07/19  Varney Biles, MD    Allergies    Coconut fatty acids, Acetaminophen, and Vicodin [hydrocodone-acetaminophen]  Review of Systems   Review of Systems  Constitutional: Positive for activity  change.  HENT: Positive for dental problem. Negative for congestion.   Respiratory: Negative for shortness of breath.   Cardiovascular: Negative for chest pain.  Gastrointestinal: Positive for abdominal pain, nausea and vomiting.  Allergic/Immunologic: Negative for immunocompromised state.  All other systems reviewed and are negative.   Physical Exam Updated Vital Signs BP 107/65   Pulse 73   Temp 98.3 F (36.8 C) (Oral)   Resp 12   LMP 04/01/2019   SpO2 99%   Physical Exam Vitals and nursing note reviewed.  Constitutional:      Appearance: She is well-developed.  HENT:     Head: Normocephalic and atraumatic.     Mouth/Throat:     Comments: Pt has no trismus, stridor or crepitus over the neck. Patient has cervical lymphadenopathy on the left side.  Tooth #17 has erosion, and there is no gingival  abscess. Cardiovascular:     Rate and Rhythm: Normal rate.  Pulmonary:     Effort: Pulmonary effort is normal.  Abdominal:     General: Bowel sounds are normal.     Tenderness: There is abdominal tenderness. There is no guarding or rebound.     Comments: Right upper quadrant tenderness and epigastric tenderness.  Musculoskeletal:     Cervical back: Normal range of motion and neck supple.  Lymphadenopathy:     Cervical: Cervical adenopathy present.  Skin:    General: Skin is warm and dry.  Neurological:     Mental Status: She is alert and oriented to person, place, and time.     ED Results / Procedures / Treatments   Labs (all labs ordered are listed, but only abnormal results are displayed) Labs Reviewed  COMPREHENSIVE METABOLIC PANEL  CBC WITH DIFFERENTIAL/PLATELET  URINALYSIS, ROUTINE W REFLEX MICROSCOPIC  I-STAT BETA HCG BLOOD, ED ([facility], [facility], [facility] ONLY)    EKG None  Radiology US Abdomen Limited  Result Date: 04/30/2019 CLINICAL DATA:  Right upper quadrant pain EXAM: ULTRASOUND ABDOMEN LIMITED RIGHT UPPER QUADRANT COMPARISON:  None. FINDINGS: Gallbladder: No gallstones or wall thickening visualized. No sonographic Murphy sign noted by sonographer. Common bile duct: Diameter: 5.3 mm Liver: Mildly increased in echogenicity which may represent mild fatty infiltration. No focal mass is noted. Portal vein is patent on color Doppler imaging with normal direction of blood flow towards the liver. Other: None. IMPRESSION: Mild fatty infiltration of the liver. Electronically Signed   By: Alcide Clever M.D.   On: 04/30/2019 16:17    Procedures Procedures (including critical care time)  Medications Ordered in ED Medications - No data to display  ED Course  I have reviewed the triage vital signs and the nursing notes.  Pertinent labs & imaging results that were available during my care of the patient were reviewed by me and considered in my medical decision making (see chart for  details).    MDM Rules/Calculators/A&P                      29 year old comes in a chief complaint of abdominal pain.  DDx includes: Pancreatitis Hepatobiliary pathology including cholecystitis Gastritis/PUD SBO  Additionally patient has toothache.  Tooth number 17 is eroded.  No signs of abscess.  We will start antibiotics and patient has been advised to follow-up with dentist.  Patient is also having abdominal pain.  Pain is mostly epigastric and right upper quadrant. Ultrasound right upper quadrant ordered.   Patient's care will be signed out to Dr. Rush Landmark.  Patient's labs are pending  and oral challenge has been initiated.  Ultrasound looks reassuring.  Beta-hCG is negative.  Patient's pain is in the upper quadrants, no concerns for pelvic etiology for her abdominal pain.  Final Clinical Impression(s) / ED Diagnoses Final diagnoses:  None    Rx / DC Orders ED Discharge Orders         Ordered    penicillin v potassium (VEETID) 250 MG tablet  4 times daily     04/30/19 1641           Derwood Kaplan, MD 04/30/19 1649

## 2019-04-30 NOTE — ED Notes (Signed)
Date and time results received: 04/30/19 4:30 PM (use smartphrase ".now" to insert current time)  Test: Hgb  Critical Value: 5.6   Name of Provider Notified: Nanavati  Orders Received? Or Actions Taken?:

## 2019-04-30 NOTE — H&P (Signed)
History and Physical    PLEASE NOTE THAT DRAGON DICTATION SOFTWARE WAS USED IN THE CONSTRUCTION OF THIS NOTE.   [name] [name] EXN:[id number] DOB: September 21, 1990 DOA: 04/30/2019  PCP: Default, Provider, MD Patient coming from: home   I have personally briefly reviewed patient's old medical records in [facility]  Chief Complaint: Dizziness  HPI: [name] [name] is a 29 y.o. female with medical history significant for chronic blood loss anemia in the setting of menorrhagia, chronic tobacco abuse, who is admitted to [facility] on 04/30/2019 with acute on chronic blood loss anemia after presenting from home to [facility] Emergency Department complaining of dizziness.   The patient reports 3 to 4 days of dizziness following completion of her menstrual cycle which was associated with 3 to 4 days of heavy flow in the context of a known history of menorrhagia.  Over the last 3 to 4 days, the patient also reports generalized weakness and fatigue and has noted intermittent palpitations in the absence of any associated chest pain, shortness of breath, or diaphoresis.  Yesterday, she also reports a presyncopal episode that occurred when attempting to stand following prolonged period of sitting down.  Denies any associated loss of consciousness.  She also reports to 3 days of mild crampy abdominal discomfort in the bilateral lower abdominal quadrants in the absence of any associated diarrhea, melena, or hematochezia.  Denies any associated subjective fever, chills, rigors were generalized myalgias.  Denies any recent trauma or travel.  No history of gastrointestinal bleed, and denies any use of blood thinners at home, including a set of aspirin.   Of note, per chart review, limited prior hemoglobin values in the context of a documented history of chronic blood loss anemia to be as follows: Hemoglobin 9.6 in April 2016, with most recent prior hemoglobin noted to be 7.0 on 06/06/2018.  The  patient reports that this most recent prior hemoglobin value was also in the context of her ongoing history of menorrhagia.  Not currently on any oral iron supplementation.  Denies any recent headache, neck stiffness, rhinitis, rhinorrhea, sore throat, cough, or rash. No known COVID-19 exposures. Denies dysuria, gross hematuria, or change in urinary urgency/frequency.      ED Course:  Vital signs in the ED were notable for the following: Temperature max 98.3; heart rate 73-78; initial blood pressure 94/60, with subsequent increase to 121/77 following initiation of transfusion of 1 unit PRBC; respiratory rate 12-17; oxygen saturation 97 to 100% on room air.  Labs were notable for the following: CMP notable for the following: Sodium 136, potassium 3.6, bicarbonate 25, BUN 13, creatinine 0.55, and liver enzymes were found to be within normal limits.  CBC notable for white blood cell count of 11,700 with 61% neutrophils, hemoglobin 5.6 associated with MCV 66.3, MCHC 26.2, and RDW 20.8, platelets 443.  Rectal exam performed in the ED was associated with fecal occult negative finding.  Quantitative hCG less than 5.0.  Urinalysis notable for no white blood cells nitrate negative, leukocyte esterase negative.  Nasopharyngeal COVID-19 PCR swab was performed in the ED this evening, with result currently pending.  Limited abdominal ultrasound showed evidence of mild fatty infiltration of the liver, but otherwise showed no evidence of acute intra-abdominal process, including no evidence of gallbladder wall thickening, pericholecystic fluid, gallstones or common bile duct dilation.   While in the ED, the following were administered: Transfusion of 1 unit PRBC was initiated.  Subsequently, the patient was admitted to the med telemetry  floor for further evaluation and management presenting acute on chronic blood loss anemia.    Review of Systems: As per HPI otherwise 10 point review of systems negative.   Past  Medical History:  Diagnosis Date  . Cervical cyst   . No pertinent past medical history     Past Surgical History:  Procedure Laterality Date  . DILATION AND CURETTAGE OF UTERUS     TAB   . NO PAST SURGERIES      Social History:  reports that she has been smoking cigarettes. She has a 2.00 pack-year smoking history. She has never used smokeless tobacco. She reports current alcohol use. She reports current drug use.   Allergies  Allergen Reactions  . Coconut Fatty Acids Anaphylaxis and Hives  . Acetaminophen Nausea Only  . Vicodin [Hydrocodone-Acetaminophen] Nausea And Vomiting    Family History  Problem Relation Age of Onset  . Stroke Mother   . Cancer Maternal Grandmother   . Other Neg Hx     Prior to Admission medications   Medication Sig Start Date End Date Taking? Authorizing Provider  fluticasone (FLONASE) 50 MCG/ACT nasal spray Place 2 sprays into both nostrils daily. Patient not taking: Reported on 06/06/2018 09/21/13   Muthersbaugh, Dahlia Client, PA-C  HYDROcodone-homatropine [facility]) 5-1.5 MG/5ML syrup Take 5 mLs by mouth every 6 (six) hours as needed for cough. Patient not taking: Reported on 06/06/2018 09/21/13   Muthersbaugh, Dahlia Client, PA-C  metroNIDAZOLE (FLAGYL) 500 MG tablet Take 1 tablet (500 mg total) by mouth 2 (two) times daily. Patient not taking: Reported on 04/30/2019 06/06/18   Nodal, Amber K, PA-C  oxyCODONE-acetaminophen (PERCOCET/ROXICET) 5-325 MG per tablet Take 1 tablet by mouth every 6 (six) hours as needed for severe pain. Patient not taking: Reported on 06/06/2018 04/19/14   Horton, Mayer Masker, MD  penicillin v potassium (VEETID) 250 MG tablet Take 1 tablet (250 mg total) by mouth 4 (four) times daily for 7 days. 04/30/19 05/07/19  Derwood Kaplan, MD     Objective    Physical Exam: Vitals:   04/30/19 1530 04/30/19 1545 04/30/19 1600 04/30/19 1813  BP: 101/63 109/62 107/65 94/60  Pulse: 70 78 73 72  Resp: 11 17 12 16   Temp:      TempSrc:      SpO2:  97% 98% 99% 97%    General: appears to be stated age; alert, oriented Skin: warm, dry, pale Head:  AT/[facility] Eyes:  PEARL b/l, EOMI Mouth:  Oral mucosa membranes appear dry, normal dentition Neck: supple; trachea midline Heart:  RRR; did not appreciate any M/R/G Lungs: CTAB, did not appreciate any wheezes, rales, or rhonchi Abdomen: + BS; soft, ND, NT Vascular: 2+ pedal pulses b/l; 2+ radial pulses b/l Extremities: no peripheral edema, no muscle wasting Neuro: strength and sensation intact in upper and lower extremities b/l   Labs on Admission: I have personally reviewed following labs and imaging studies  CBC: Recent Labs  Lab 04/30/19 1524  WBC 11.7*  NEUTROABS 7.1  HGB 5.6*  HCT 21.4*  MCV 66.3*  PLT 443*   Basic Metabolic Panel: Recent Labs  Lab 04/30/19 1524  NA 136  K 3.6  CL 105  CO2 25  GLUCOSE 90  BUN 13  CREATININE 0.55  CALCIUM 8.6*   GFR: CrCl cannot be calculated (Unknown ideal weight.). Liver Function Tests: Recent Labs  Lab 04/30/19 1524  AST 27  ALT 20  ALKPHOS 42  BILITOT 0.3  PROT 6.8  ALBUMIN 3.5   No  results for input(s): LIPASE, AMYLASE in the last 168 hours. No results for input(s): AMMONIA in the last 168 hours. Coagulation Profile: No results for input(s): INR, PROTIME in the last 168 hours. Cardiac Enzymes: No results for input(s): CKTOTAL, CKMB, CKMBINDEX, TROPONINI in the last 168 hours. BNP (last 3 results) No results for input(s): PROBNP in the last 8760 hours. HbA1C: No results for input(s): HGBA1C in the last 72 hours. CBG: No results for input(s): GLUCAP in the last 168 hours. Lipid Profile: No results for input(s): CHOL, HDL, LDLCALC, TRIG, CHOLHDL, LDLDIRECT in the last 72 hours. Thyroid Function Tests: No results for input(s): TSH, T4TOTAL, FREET4, T3FREE, THYROIDAB in the last 72 hours. Anemia Panel: No results for input(s): VITAMINB12, FOLATE, FERRITIN, TIBC, IRON, RETICCTPCT in the last 72 hours. Urine  analysis:    Component Value Date/Time   COLORURINE STRAW (A) 06/06/2018 2113   APPEARANCEUR CLEAR 06/06/2018 2113   LABSPEC 1.009 06/06/2018 2113   PHURINE 8.0 06/06/2018 2113   GLUCOSEU NEGATIVE 06/06/2018 2113   HGBUR NEGATIVE 06/06/2018 2113   BILIRUBINUR NEGATIVE 06/06/2018 2113   KETONESUR NEGATIVE 06/06/2018 2113   PROTEINUR NEGATIVE 06/06/2018 2113   UROBILINOGEN 0.2 10/23/2012 0915   NITRITE NEGATIVE 06/06/2018 2113   LEUKOCYTESUR NEGATIVE 06/06/2018 2113    Radiological Exams on Admission: US Abdomen Limited  Result Date: 04/30/2019 CLINICAL DATA:  Right upper quadrant pain EXAM: ULTRASOUND ABDOMEN LIMITED RIGHT UPPER QUADRANT COMPARISON:  None. FINDINGS: Gallbladder: No gallstones or wall thickening visualized. No sonographic Murphy sign noted by sonographer. Common bile duct: Diameter: 5.3 mm Liver: Mildly increased in echogenicity which may represent mild fatty infiltration. No focal mass is noted. Portal vein is patent on color Doppler imaging with normal direction of blood flow towards the liver. Other: None. IMPRESSION: Mild fatty infiltration of the liver. Electronically Signed   By: Alcide Clever M.D.   On: 04/30/2019 16:17     Assessment/Plan   [name] [name] is a 29 y.o. female with medical history significant for chronic blood loss anemia in the setting of menorrhagia, chronic tobacco abuse, who is admitted to [facility] on 04/30/2019 with acute on chronic blood loss anemia after presenting from home to [facility] Emergency Department complaining of dizziness.   Principal Problem:   Acute on chronic blood loss anemia Active Problems:   Tobacco abuse   Leukocytosis   Palpitations   #) Acute on chronic blood loss anemia: In the context of a history of chronic blood loss anemia secondary to menorrhagia, the patient presents today complaining of 3 to 4 days of dizziness, generalized weakness, fatigue, palpitations, and an episode of presyncope,  all of which started following completion of most recent menstrual cycle associated with 3 to 4 days of heavy flow, with presenting hemoglobin found to be 5.6, relative to most recent prior value of 7.0 in May 2020.  Suspect menorrhagia as the underlying source with the patient's chronic blood loss anemia as well as the acute exacerbation leading to today symptomatic presentation.  Less likely that there is an underlying contribution from gastrointestinal bleed given the patient's report of no melena, hematochezia, or hematemesis, and further supported by Hemoccult stool negative finding in the ED this evening.  Initial blood pressure found to be borderline hypotensive, which is resolved following initiation of transfusion of 1 unit PRBC, which was ordered in the setting of symptomatic anemia.  Not on any blood thinning agents at home.  No known history of underlying liver disease.  Of  note, presenting anemia found to be microcytic, hypochromic, and with an elevated RDW, all consistent with blood loss anemia.  Plan: Continue transfusion of 1 unit PRBC over 2 hours.  I have ordered a repeat hemoglobin to be checked approximately 30 minutes following the completion of transfusion of this 1 unit PRBC, with clinical correlation regarding interval symptoms and vital sign analysis to assist with determination of need for additional blood transfusion.  Regardless of hemodynamic stability and symptoms, would transfuse for follow-up hemoglobin less than 7.0.  Check INR.  Repeat CBC in the morning.  Monitor on telemetry.  Monitor continuous pulse oximetry.  Confirm that patient has an established outpatient gynecologist.  SCDs.  Add on the following laboratory studies to preblood transfusion specimen collected in the ED: Total iron, TIBC, ferritin, MMA, folic acid, reticulocyte count, and peripheral smear.     #) Leukocytosis: Mild elevation of white blood cell count, with presenting value noted to be 11,700 with 61%  neutrophils.  Additionally includes inflammatory contribution setting of recent completion of menstrual cycle associated with menorrhagia versus hemoconcentration affect of acute blood loss anemia.  No clinical evidence to suggest underlying infectious process at this time, including presenting urinalysis negative for UTI in addition to limited abdominal ultrasound performed in the ED this evening which showed no evidence to suggest acute cholecystitis.   Plan: Monitor for result of screening nasopharyngeal COVID-19 PCR.  Repeat CBC with differential in the morning.  Could consider pursuing chest x-ray if repeat labs do not reflect interval improvement in leukocytosis.     #) Palpitations: The patient reports 3 to 4 days of intermittent palpitations in the absence of any associated chest pain or shortness of breath.  I suspect that this symptom is on the basis of compensatory mechanism in the context of intravascular depletion due to acute on chronic blood loss anemia, as further described above.   Plan: Continue blood transfusion as above, with monitoring for subsequent resolution of palpitations following improvement in hemoglobin.  Monitor on telemetry.  EKG x1 now.  Urinary drug screen.      #) Chronic tobacco abuse: Patient reports that she is a current smoker, having smoked approximately quarter pack per day over the last 10 years.  Counseled the patient on the importance of complete smoking discontinuation.  I placed an order for as needed nicotine patch for use during his hospitalization.    DVT prophylaxis: SCDs Code Status: Full code Family Communication: none Disposition Plan: Per Rounding Team Consults called: none  Admission status: Inpatient; med telemetry.    PLEASE NOTE THAT DRAGON DICTATION SOFTWARE WAS USED IN THE CONSTRUCTION OF THIS NOTE.   [facility] Pager [phone] From [place].   Otherwise, please contact night-coverage    [email or web address] Password [facility]  04/30/2019, 6:46 PM

## 2019-04-30 NOTE — ED Notes (Signed)
Patient currently crying stating she needs to go outside and smoke a cigarette, this nurse explained hospital policy and offered her a nicotine patch. Patient agreed but continues to cry stating its not fair she cant step outside to smoke.

## 2019-04-30 NOTE — ED Triage Notes (Signed)
Pt reports having a toothache last night

## 2019-04-30 NOTE — ED Notes (Signed)
Asked pt to give a urine sample and pt sts she cant give a sample at this time

## 2019-04-30 NOTE — ED Notes (Signed)
Patient still apprehensive about staying in the hospital and receiving blood transfusion, benefits and risks explained to patient. Patient verbalized understanding.

## 2019-04-30 NOTE — ED Notes (Signed)
Pt successful with PO challenge, pt had no emesis or nausea.

## 2019-05-01 ENCOUNTER — Encounter (HOSPITAL_COMMUNITY): Payer: Self-pay | Admitting: Internal Medicine

## 2019-05-01 ENCOUNTER — Inpatient Hospital Stay (HOSPITAL_COMMUNITY): Payer: Self-pay

## 2019-05-01 DIAGNOSIS — Z720 Tobacco use: Secondary | ICD-10-CM | POA: Diagnosis present

## 2019-05-01 DIAGNOSIS — R002 Palpitations: Secondary | ICD-10-CM | POA: Diagnosis present

## 2019-05-01 DIAGNOSIS — D72829 Elevated white blood cell count, unspecified: Secondary | ICD-10-CM | POA: Diagnosis present

## 2019-05-01 LAB — ABO/RH: ABO/RH(D): A POS

## 2019-05-01 LAB — CBC WITH DIFFERENTIAL/PLATELET
Abs Immature Granulocytes: 0.03 10*3/uL (ref 0.00–0.07)
Basophils Absolute: 0.1 10*3/uL (ref 0.0–0.1)
Basophils Relative: 1 %
Eosinophils Absolute: 0.1 10*3/uL (ref 0.0–0.5)
Eosinophils Relative: 1 %
HCT: 23.9 % — ABNORMAL LOW (ref 36.0–46.0)
Hemoglobin: 6.6 g/dL — CL (ref 12.0–15.0)
Immature Granulocytes: 0 %
Lymphocytes Relative: 32 %
Lymphs Abs: 3 10*3/uL (ref 0.7–4.0)
MCH: 19 pg — ABNORMAL LOW (ref 26.0–34.0)
MCHC: 27.6 g/dL — ABNORMAL LOW (ref 30.0–36.0)
MCV: 68.9 fL — ABNORMAL LOW (ref 80.0–100.0)
Monocytes Absolute: 0.8 10*3/uL (ref 0.1–1.0)
Monocytes Relative: 9 %
Neutro Abs: 5.6 10*3/uL (ref 1.7–7.7)
Neutrophils Relative %: 57 %
Platelets: 363 10*3/uL (ref 150–400)
RBC: 3.47 MIL/uL — ABNORMAL LOW (ref 3.87–5.11)
RDW: 22.4 % — ABNORMAL HIGH (ref 11.5–15.5)
WBC: 9.6 10*3/uL (ref 4.0–10.5)
nRBC: 0 % (ref 0.0–0.2)

## 2019-05-01 LAB — BPAM RBC
Blood Product Expiration Date: 202105022359
ISSUE DATE / TIME: 202104132239
Unit Type and Rh: 6200

## 2019-05-01 LAB — MAGNESIUM: Magnesium: 2 mg/dL (ref 1.7–2.4)

## 2019-05-01 LAB — COMPREHENSIVE METABOLIC PANEL
ALT: 19 U/L (ref 0–44)
AST: 27 U/L (ref 15–41)
Albumin: 3.5 g/dL (ref 3.5–5.0)
Alkaline Phosphatase: 41 U/L (ref 38–126)
Anion gap: 7 (ref 5–15)
BUN: 8 mg/dL (ref 6–20)
CO2: 26 mmol/L (ref 22–32)
Calcium: 9 mg/dL (ref 8.9–10.3)
Chloride: 104 mmol/L (ref 98–111)
Creatinine, Ser: 0.53 mg/dL (ref 0.44–1.00)
GFR calc Af Amer: >60 mL/min (ref >60)
GFR calc non Af Amer: >60 mL/min (ref >60)
Glucose, Bld: 92 mg/dL (ref 70–99)
Potassium: 3.6 mmol/L (ref 3.5–5.1)
Sodium: 137 mmol/L (ref 135–145)
Total Bilirubin: 1 mg/dL (ref 0.3–1.2)
Total Protein: 6.7 g/dL (ref 6.5–8.1)

## 2019-05-01 LAB — TYPE AND SCREEN
ABO/RH(D): A POS
Antibody Screen: NEGATIVE
Unit division: 0

## 2019-05-01 LAB — LIPASE, BLOOD: Lipase: 19 U/L (ref 11–51)

## 2019-05-01 LAB — PROTIME-INR
INR: 1 (ref 0.8–1.2)
Prothrombin Time: 12.8 s (ref 11.4–15.2)

## 2019-05-01 LAB — PATHOLOGIST SMEAR REVIEW

## 2019-05-01 LAB — HIV ANTIBODY (ROUTINE TESTING W REFLEX): HIV Screen 4th Generation wRfx: NONREACTIVE

## 2019-05-01 LAB — HEMOGLOBIN: Hemoglobin: 6.6 g/dL — CL (ref 12.0–15.0)

## 2019-05-01 MED ORDER — DICLOFENAC SODIUM 50 MG PO TBEC
50.0000 mg | DELAYED_RELEASE_TABLET | Freq: Three times a day (TID) | ORAL | Status: DC | PRN
  Administered 2019-05-01: 12:00:00 50 mg via ORAL
  Filled 2019-05-01 (×4): qty 1

## 2019-05-01 MED ORDER — FERROUS GLUCONATE 324 (38 FE) MG PO TABS: 324.0000 mg | ORAL_TABLET | Freq: Every day | ORAL | 3 refills | Status: DC

## 2019-05-01 MED ORDER — IOHEXOL 300 MG/ML  SOLN
75.0000 mL | Freq: Once | INTRAMUSCULAR | Status: AC | PRN
  Administered 2019-05-01: 12:00:00 75 mL via INTRAVENOUS

## 2019-05-01 MED ORDER — HYDROXYZINE HCL 25 MG PO TABS
50.0000 mg | ORAL_TABLET | Freq: Four times a day (QID) | ORAL | Status: DC | PRN
  Administered 2019-05-01: 09:00:00 50 mg via ORAL
  Filled 2019-05-01: qty 2

## 2019-05-01 MED ORDER — SODIUM CHLORIDE 0.9 % IV SOLN
510.0000 mg | Freq: Once | INTRAVENOUS | Status: AC
  Administered 2019-05-01: 16:00:00 510 mg via INTRAVENOUS
  Filled 2019-05-01: qty 510

## 2019-05-01 MED ORDER — SODIUM CHLORIDE (PF) 0.9 % IJ SOLN
INTRAMUSCULAR | Status: AC
  Filled 2019-05-01: qty 50

## 2019-05-01 NOTE — TOC Initial Note (Signed)
Transition of Care [facility]) - Initial/Assessment Note    Patient Details  Name: [name] [name] MRN: [id number] Date of Birth: 1990/12/25  [facility] CM/SW Contact:    Ida Rogue, LCSW Phone Number: 05/01/2019, 10:27 AM  Clinical Narrative:   CSW saw patient in response to physician request for PCP, possible SA cessation services.  Found [name] [name] to be engageable, open to speaking about her addiction.  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 She is interested in a clinic where she could get counseling and possibly methadone.  Gave her information about [facility], and put it in AVS as well.  As far as PCP, she knows where the clinic is next door, and would like an appointment with them. TOC will continue to follow during the course of hospitalization.                Expected Discharge Plan: Home/Self Care Barriers to Discharge: No Barriers Identified   Patient Goals and CMS Choice Patient states their goals for this hospitalization and ongoing recovery are:: "I need help with my drug addiction."      Expected Discharge Plan and Services Expected Discharge Plan: Home/Self Care In-house Referral: Clinical Social Work     Living arrangements for the past 2 months: Apartment                                      Prior Living Arrangements/Services Living arrangements for the past 2 months: Apartment Lives with:: Roommate Patient language and need for interpreter reviewed:: Yes Do you feel safe going back to the place where you live?: Yes      Need for Family Participation in Patient Care: No (Comment) Care giver support system in place?: No (comment)   Criminal Activity/Legal Involvement Pertinent to Current Situation/Hospitalization: No - Comment as needed  Activities of Daily Living Home Assistive Devices/Equipment: None ADL Screening (condition  at time of admission) Patient's cognitive ability adequate to safely complete daily activities?: Yes Is the patient deaf or have difficulty hearing?: No Does the patient have difficulty seeing, even when wearing glasses/contacts?: No Does the patient have difficulty concentrating, remembering, or making decisions?: Yes(very sleepy) Patient able to express need for assistance with ADLs?: Yes Does the patient have difficulty dressing or bathing?: No Independently performs ADLs?: Yes (appropriate for developmental age) Does the patient have difficulty walking or climbing stairs?: No Weakness of Legs: None Weakness of Arms/Hands: None  Permission Sought/Granted                  Emotional Assessment Appearance:: Appears stated age Attitude/Demeanor/Rapport: Engaged Affect (typically observed): Appropriate Orientation: : Oriented to Self, Oriented to Place, Oriented to  Time, Oriented to Situation Alcohol / Substance Use: Not Applicable Psych Involvement: No (comment)  Admission diagnosis:  Symptomatic anemia [D64.9] Acute on chronic blood loss anemia [D62] Patient Active Problem List   Diagnosis Date Noted  . Tobacco abuse 05/01/2019  . Leukocytosis 05/01/2019  . Palpitations 05/01/2019  . Acute on chronic blood loss anemia 04/30/2019  . GBS (group B Streptococcus carrier), +RV culture, currently pregnant 12/21/2011  . History of substance abuse (HCC) 12/21/2011  . Supervision of normal pregnancy 12/12/2011   PCP:  Default, Provider, MD Pharmacy:   [facility] [address]  [place] [address] Phone: [phone] Fax: [phone]     Social Determinants of Health (SDOH) Interventions    Readmission Risk Interventions No flowsheet data found.

## 2019-05-01 NOTE — Discharge Instructions (Signed)
[name] [name],  You were in the hospital because of symptomatic anemia. You received a blood transfusion. Please follow-up with a Theatre manager

## 2019-05-01 NOTE — Progress Notes (Signed)
Tacy Dura to be D/C'd Home per MD order.  Discussed prescriptions and follow up appointments with the patient. Prescriptions given to patient, medication list explained in detail. Pt verbalized understanding.  Allergies as of 05/01/2019      Reactions   Coconut Fatty Acids Anaphylaxis, Hives   Acetaminophen Nausea Only   Vicodin [hydrocodone-acetaminophen] Nausea And Vomiting      Medication List    STOP taking these medications   fluticasone 50 MCG/ACT nasal spray Commonly known as: FLONASE   HYDROcodone-homatropine 5-1.5 MG/5ML syrup Commonly known as: HYCODAN   metroNIDAZOLE 500 MG tablet Commonly known as: FLAGYL   oxyCODONE-acetaminophen 5-325 MG tablet Commonly known as: PERCOCET/ROXICET     TAKE these medications   ferrous gluconate 324 MG tablet Commonly known as: FERGON Take 1 tablet (324 mg total) by mouth daily with breakfast.   penicillin v potassium 250 MG tablet Commonly known as: VEETID Take 1 tablet (250 mg total) by mouth 4 (four) times daily for 7 days.       Vitals:   05/01/19 0828 05/01/19 1229  BP: (!) 120/57 104/71  Pulse: (!) 56 61  Resp: 16 (!) 21  Temp: 98.9 F (37.2 C) 99.3 F (37.4 C)  SpO2: 100% 100%    Skin clean, dry and intact without evidence of skin break down, no evidence of skin tears noted. IV catheter discontinued intact. Site without signs and symptoms of complications. Dressing and pressure applied. Pt denies pain at this time. No complaints noted.  An After Visit Summary was printed and given to the patient. Patient escorted via WC, and D/C home via private auto.  Rondel Jumbo 05/01/2019 4:55 PM

## 2019-05-01 NOTE — Discharge Summary (Signed)
Physician Discharge Summary  Riyan Gavina RXV:[id number] DOB: [birth year] DOA: 04/30/2019  PCP: Default, Provider, MD  Admit date: 04/30/2019 Discharge date: 05/01/2019  Admitted From: Home Disposition: Home  Recommendations for Outpatient Follow-up:  1. Follow up with PCP in 1 week 2. Please obtain BMP/CBC in one week 3. Please follow up on the following pending results: None   Discharge Condition: stable. CODE STATUS: Full code Diet recommendation: Regular   Brief/Interim Summary:  Admission HPI written by Angie Fava, DO   Chief Complaint: Dizziness  HPI: [name] [name] is a 29 y.o. female with medical history significant for chronic blood loss anemia in the setting of menorrhagia, chronic tobacco abuse, who is admitted to [facility] on 04/30/2019 with acute on chronic blood loss anemia after presenting from home to [facility] Emergency Department complaining of dizziness.   The patient reports 3 to 4 days of dizziness following completion of her menstrual cycle which was associated with 3 to 4 days of heavy flow in the context of a known history of menorrhagia.  Over the last 3 to 4 days, the patient also reports generalized weakness and fatigue and has noted intermittent palpitations in the absence of any associated chest pain, shortness of breath, or diaphoresis.  Yesterday, she also reports a presyncopal episode that occurred when attempting to stand following prolonged period of sitting down.  Denies any associated loss of consciousness.  She also reports to 3 days of mild crampy abdominal discomfort in the bilateral lower abdominal quadrants in the absence of any associated diarrhea, melena, or hematochezia.  Denies any associated subjective fever, chills, rigors were generalized myalgias.  Denies any recent trauma or travel.  No history of gastrointestinal bleed, and denies any use of blood thinners at home, including a set of aspirin.   Of note,  per chart review, limited prior hemoglobin values in the context of a documented history of chronic blood loss anemia to be as follows: Hemoglobin 9.6 in April 2016, with most recent prior hemoglobin noted to be 7.0 on 06/06/2018.  The patient reports that this most recent prior hemoglobin value was also in the context of her ongoing history of menorrhagia.  Not currently on any oral iron supplementation.  Denies any recent headache, neck stiffness, rhinitis, rhinorrhea, sore throat, cough, or rash. No known COVID-19 exposures. Denies dysuria, gross hematuria, or change in urinary urgency/frequency.   Hospital course:  Symptomatic anemia Secondary to menorrhagia. Baseline hemoglobin of 7. Patient presented with a hemoglobin of 5.6. She received 1 unit of PRBC with improvement of hemoglobin to 6.6. Patient symptomatic at time of discharge. Iron of 18 and ferritin of 2. Feraheme given prior to discharge.  Menorrhagia Patient does not have a PCP or gynecologist. Recommendation for PCP follow-up and gynecology referral. Per patient, she has a family history of fibroids which may be contributing to menorrhagia.   Left tooth pain Eroded left lower molar. Penicillin V prescribed by ED. Recommend dentist follow-up.  Tobacco abuse Nicotine patch while inpatient.  Left tooth pain Secondary to eroded molar. CT maxillofacial without evidence of abscess. Prescribed Penicillin in the ED. Okay to continue penicillin prescription.  Opiate abuse Patient snorts heroin. She does not inject. Social work consulted for resources to quit.  Discharge Diagnoses:  Principal Problem:   Acute on chronic blood loss anemia Active Problems:   Tobacco abuse   Leukocytosis   Palpitations    Discharge Instructions  Discharge Instructions    Call MD for:  difficulty breathing, headache or visual disturbances   Complete by: As directed    Call MD for:  extreme fatigue   Complete by: As directed    Call MD for:   persistant dizziness or light-headedness   Complete by: As directed    Call MD for:  temperature >100.4   Complete by: As directed    Diet - low sodium heart healthy   Complete by: As directed    Increase activity slowly   Complete by: As directed      Allergies as of 05/01/2019      Reactions   Coconut Fatty Acids Anaphylaxis, Hives   Acetaminophen Nausea Only   Vicodin [hydrocodone-acetaminophen] Nausea And Vomiting      Medication List    STOP taking these medications   fluticasone 50 MCG/ACT nasal spray Commonly known as: FLONASE   HYDROcodone-homatropine 5-1.5 MG/5ML syrup Commonly known as: HYCODAN   metroNIDAZOLE 500 MG tablet Commonly known as: FLAGYL   oxyCODONE-acetaminophen 5-325 MG tablet Commonly known as: PERCOCET/ROXICET     TAKE these medications   ferrous gluconate 324 MG tablet Commonly known as: FERGON Take 1 tablet (324 mg total) by mouth daily with breakfast.   penicillin v potassium 250 MG tablet Commonly known as: VEETID Take 1 tablet (250 mg total) by mouth 4 (four) times daily for 7 days.      Follow-up Information    [facility] Follow [facility].   Why: This clinic offers free counseling and methadone if you qualify.  Call them to set up an assessment for services. Contact information: [address], [place], [place] [address] Phone: [phone]       [facility] Follow up on 05/28/2019.   Specialty: Internal Medicine Why: Tuesday at 1:00 for your hospital follow up appointment Contact information: [address] [id number] [place] [phone]         Allergies  Allergen Reactions  . Coconut Fatty Acids Anaphylaxis and Hives  . Acetaminophen Nausea Only  . Vicodin [Hydrocodone-Acetaminophen] Nausea And Vomiting    Consultations:  None   Procedures/Studies: US Abdomen Limited  Result Date: 04/30/2019 CLINICAL DATA:  Right upper quadrant pain EXAM:  ULTRASOUND ABDOMEN LIMITED RIGHT UPPER QUADRANT COMPARISON:  None. FINDINGS: Gallbladder: No gallstones or wall thickening visualized. No sonographic Murphy sign noted by sonographer. Common bile duct: Diameter: 5.3 mm Liver: Mildly increased in echogenicity which may represent mild fatty infiltration. No focal mass is noted. Portal vein is patent on color Doppler imaging with normal direction of blood flow towards the liver. Other: None. IMPRESSION: Mild fatty infiltration of the liver. Electronically Signed   By: Alcide CleverMark  Lukens M.D.   On: 04/30/2019 16:17   CT MAXILLOFACIAL W CONTRAST  Result Date: 05/01/2019 CLINICAL DATA:  Left facial swelling and pain EXAM: CT MAXILLOFACIAL WITH CONTRAST TECHNIQUE: Multidetector CT imaging of the maxillofacial structures was performed with intravenous contrast. Multiplanar CT image reconstructions were also generated. CONTRAST:  75mL OMNIPAQUE IOHEXOL 300 MG/ML  SOLN COMPARISON:  None. FINDINGS: Osseous: Temporomandibular joints are unremarkable. There is no acute facial fracture. Partially edentulous with evidence of tooth decay including periapical lucency about the posterior most remaining mandibular teeth bilaterally. No dehiscence of buccal or lingual cortex. Orbits: Unremarkable apart from incidental probable lacrimal gland prolapse. Sinuses: Unremarkable. Soft tissues: No substantial soft tissue swelling or evidence of abscess. Possible mild asymmetric soft tissue thickening along the buccal aspect of the left mandible. Limited intracranial: No abnormal  enhancement. IMPRESSION: No significant inflammatory changes or evidence of abscess identified. There is dental disease including periapical lucency about the most posterior remaining left mandibular tooth. Possible mild asymmetric soft tissue thickening along the left mandible. Electronically Signed   By: Guadlupe Spanish M.D.   On: 05/01/2019 14:18     Subjective: Tooth pain. Otherwise, no issues.  Discharge  Exam: Vitals:   05/01/19 0828 05/01/19 1229  BP: (!) 120/57 104/71  Pulse: (!) 56 61  Resp: 16 (!) 21  Temp: 98.9 F (37.2 C) 99.3 F (37.4 C)  SpO2: 100% 100%   Vitals:   05/01/19 0630 05/01/19 0700 05/01/19 0828 05/01/19 1229  BP: 110/72 105/74 (!) 120/57 104/71  Pulse: 73 66 (!) 56 61  Resp: (!) 24 14 16  (!) 21  Temp:   98.9 F (37.2 C) 99.3 F (37.4 C)  TempSrc:   Oral Oral  SpO2: 95% 100% 100% 100%    General: Pt is alert, awake, not in acute distress HEENT: Some mild tenderness of left mandible with no fluctuance or mass felt. Left lower molar eroded. Cardiovascular: RRR, S1/S2 +, no rubs, no gallops Respiratory: CTA bilaterally, no wheezing, no rhonchi Abdominal: Soft, NT, ND, bowel sounds + Extremities: no edema, no cyanosis    The results of significant diagnostics from this hospitalization (including imaging, microbiology, ancillary and laboratory) are listed below for reference.     Microbiology: Recent Results (from the past 240 hour(s))  Respiratory Panel by RT PCR (Flu A&B, Covid) - Nasopharyngeal Swab     Status: None   Collection Time: 04/30/19  6:37 PM   Specimen: Nasopharyngeal Swab  Result Value Ref Range Status   SARS Coronavirus 2 by RT PCR NEGATIVE NEGATIVE Final    Comment: (NOTE) SARS-CoV-2 target nucleic acids are NOT DETECTED. The SARS-CoV-2 RNA is generally detectable in upper respiratoy specimens during the acute phase of infection. The lowest concentration of SARS-CoV-2 viral copies this assay can detect is 131 copies/mL. A negative result does not preclude SARS-Cov-2 infection and should not be used as the sole basis for treatment or other patient management decisions. A negative result may occur with  improper specimen collection/handling, submission of specimen other than nasopharyngeal swab, presence of viral mutation(s) within the areas targeted by this assay, and inadequate number of viral copies (<131 copies/mL). A negative  result must be combined with clinical observations, patient history, and epidemiological information. The expected result is Negative. Fact Sheet for Patients:  05/02/19 Fact Sheet for Healthcare Providers:  https://www.moore.com/ This test is not yet ap proved or cleared by the https://www.young.biz/ FDA and  has been authorized for detection and/or diagnosis of SARS-CoV-2 by FDA under an Emergency Use Authorization (EUA). This EUA will remain  in effect (meaning this test can be used) for the duration of the COVID-19 declaration under Section 564(b)(1) of the Act, 21 U.S.C. section 360bbb-3(b)(1), unless the authorization is terminated or revoked sooner.    Influenza A by PCR NEGATIVE NEGATIVE Final   Influenza B by PCR NEGATIVE NEGATIVE Final    Comment: (NOTE) The Xpert Xpress SARS-CoV-2/FLU/RSV assay is intended as an aid in  the diagnosis of influenza from Nasopharyngeal swab specimens and  should not be used as a sole basis for treatment. Nasal washings and  aspirates are unacceptable for Xpert Xpress SARS-CoV-2/FLU/RSV  testing. Fact Sheet for Patients: [place] Fact Sheet for Healthcare Providers: https://www.moore.com/ This test is not yet approved or cleared by the https://www.young.biz/ FDA and  has been  authorized for detection and/or diagnosis of SARS-CoV-2 by  FDA under an Emergency Use Authorization (EUA). This EUA will remain  in effect (meaning this test can be used) for the duration of the  Covid-19 declaration under Section 564(b)(1) of the Act, 21  U.S.C. section 360bbb-3(b)(1), unless the authorization is  terminated or revoked. Performed at [facility], [place] [address], [place], [place] [address]      Labs: BNP (last 3 results) No results for input(s): BNP in the last 8760 hours. Basic Metabolic Panel: Recent Labs  Lab 04/30/19 1524  04/30/19 2056 05/01/19 0501  NA 136  --  137  K 3.6  --  3.6  CL 105  --  104  CO2 25  --  26  GLUCOSE 90  --  92  BUN 13  --  8  CREATININE 0.55  --  0.53  CALCIUM 8.6*  --  9.0  MG  --  1.7 2.0   Liver Function Tests: Recent Labs  Lab 04/30/19 1524 05/01/19 0501  AST 27 27  ALT 20 19  ALKPHOS 42 41  BILITOT 0.3 1.0  PROT 6.8 6.7  ALBUMIN 3.5 3.5   Recent Labs  Lab 05/01/19 0501  LIPASE 19   No results for input(s): AMMONIA in the last 168 hours. CBC: Recent Labs  Lab 04/30/19 1524 05/01/19 0501  WBC 11.7* 9.6  NEUTROABS 7.1 5.6  HGB 5.6* 6.6*  6.6*  HCT 21.4* 23.9*  MCV 66.3* 68.9*  PLT 443* 363   Cardiac Enzymes: No results for input(s): CKTOTAL, CKMB, CKMBINDEX, TROPONINI in the last 168 hours. BNP: Invalid input(s): POCBNP CBG: No results for input(s): GLUCAP in the last 168 hours. D-Dimer No results for input(s): DDIMER in the last 72 hours. Hgb A1c No results for input(s): HGBA1C in the last 72 hours. Lipid Profile No results for input(s): CHOL, HDL, LDLCALC, TRIG, CHOLHDL, LDLDIRECT in the last 72 hours. Thyroid function studies No results for input(s): TSH, T4TOTAL, T3FREE, THYROIDAB in the last 72 hours.  Invalid input(s): FREET3 Anemia work up Recent Labs    04/30/19 1524 04/30/19 2100  FOLATE  --  11.7  FERRITIN  --  2*  TIBC  --  584*  IRON  --  18*  RETICCTPCT 1.0  --    Urinalysis    Component Value Date/Time   COLORURINE YELLOW 04/30/2019 1848   APPEARANCEUR CLEAR 04/30/2019 1848   LABSPEC 1.016 04/30/2019 1848   PHURINE 7.0 04/30/2019 1848   GLUCOSEU NEGATIVE 04/30/2019 1848   HGBUR NEGATIVE 04/30/2019 1848   BILIRUBINUR NEGATIVE 04/30/2019 1848   KETONESUR NEGATIVE 04/30/2019 1848   PROTEINUR NEGATIVE 04/30/2019 1848   UROBILINOGEN 0.2 10/23/2012 0915   NITRITE NEGATIVE 04/30/2019 1848   LEUKOCYTESUR NEGATIVE 04/30/2019 1848   Sepsis Labs Invalid input(s): PROCALCITONIN,  WBC,  LACTICIDVEN Microbiology Recent  Results (from the past 240 hour(s))  Respiratory Panel by RT PCR (Flu A&B, Covid) - Nasopharyngeal Swab     Status: None   Collection Time: 04/30/19  6:37 PM   Specimen: Nasopharyngeal Swab  Result Value Ref Range Status   SARS Coronavirus 2 by RT PCR NEGATIVE NEGATIVE Final    Comment: (NOTE) SARS-CoV-2 target nucleic acids are NOT DETECTED. The SARS-CoV-2 RNA is generally detectable in upper respiratoy specimens during the acute phase of infection. The lowest concentration of SARS-CoV-2 viral copies this assay can detect is 131 copies/mL. A negative result does not preclude SARS-Cov-2 infection and should not be used as  the sole basis for treatment or other patient management decisions. A negative result may occur with  improper specimen collection/handling, submission of specimen other than nasopharyngeal swab, presence of viral mutation(s) within the areas targeted by this assay, and inadequate number of viral copies (<131 copies/mL). A negative result must be combined with clinical observations, patient history, and epidemiological information. The expected result is Negative. Fact Sheet for Patients:  https://www.moore.com/ Fact Sheet for Healthcare Providers:  https://www.young.biz/ This test is not yet ap proved or cleared by the [place] FDA and  has been authorized for detection and/or diagnosis of SARS-CoV-2 by FDA under an Emergency Use Authorization (EUA). This EUA will remain  in effect (meaning this test can be used) for the duration of the COVID-19 declaration under Section 564(b)(1) of the Act, 21 U.S.C. section 360bbb-3(b)(1), unless the authorization is terminated or revoked sooner.    Influenza A by PCR NEGATIVE NEGATIVE Final   Influenza B by PCR NEGATIVE NEGATIVE Final    Comment: (NOTE) The Xpert Xpress SARS-CoV-2/FLU/RSV assay is intended as an aid in  the diagnosis of influenza from Nasopharyngeal swab specimens  and  should not be used as a sole basis for treatment. Nasal washings and  aspirates are unacceptable for Xpert Xpress SARS-CoV-2/FLU/RSV  testing. Fact Sheet for Patients: https://www.moore.com/ Fact Sheet for Healthcare Providers: https://www.young.biz/ This test is not yet approved or cleared by the [place] FDA and  has been authorized for detection and/or diagnosis of SARS-CoV-2 by  FDA under an Emergency Use Authorization (EUA). This EUA will remain  in effect (meaning this test can be used) for the duration of the  Covid-19 declaration under Section 564(b)(1) of the Act, 21  U.S.C. section 360bbb-3(b)(1), unless the authorization is  terminated or revoked. Performed at [facility], [address], [place], [place] [address]     SIGNED:   Jacquelin Hawking, MD [facility] 05/01/2019, 3:10 PM

## 2019-05-01 NOTE — Progress Notes (Signed)
During initial assessment, patient verbalized that she uses drugs. When asked, she stated that she uses heroin, about 1 to 1.5 g per day. Patient is anxious, tearful, and very upset, stating "I'm scared I may be withdrawing". Last use of heroin was Monday 04/29/19. MD made aware. Will continue to monitor.

## 2019-05-01 NOTE — Plan of Care (Signed)
  Problem: Education: Goal: Knowledge of General Education information will improve Description Including pain rating scale, medication(s)/side effects and non-pharmacologic comfort measures Outcome: Adequate for Discharge   Problem: Health Behavior/Discharge Planning: Goal: Ability to manage health-related needs will improve Outcome: Adequate for Discharge   Problem: Clinical Measurements: Goal: Ability to maintain clinical measurements within normal limits will improve Outcome: Adequate for Discharge Goal: Will remain free from infection Outcome: Adequate for Discharge Goal: Diagnostic test results will improve Outcome: Adequate for Discharge Goal: Respiratory complications will improve Outcome: Adequate for Discharge Goal: Cardiovascular complication will be avoided Outcome: Adequate for Discharge   Problem: Activity: Goal: Risk for activity intolerance will decrease Outcome: Adequate for Discharge   Problem: Nutrition: Goal: Adequate nutrition will be maintained Outcome: Adequate for Discharge   Problem: Coping: Goal: Level of anxiety will decrease Outcome: Adequate for Discharge   Problem: Pain Managment: Goal: General experience of comfort will improve Outcome: Adequate for Discharge   Problem: Safety: Goal: Ability to remain free from injury will improve Outcome: Adequate for Discharge   

## 2019-05-09 LAB — METHYLMALONIC ACID, SERUM: Methylmalonic Acid, Quantitative: 157 nmol/L (ref 0–378)

## 2019-05-28 ENCOUNTER — Ambulatory Visit: Payer: Self-pay | Admitting: Family Medicine

## 2019-06-24 ENCOUNTER — Ambulatory Visit: Payer: Self-pay | Admitting: Family Medicine

## 2020-01-31 ENCOUNTER — Other Ambulatory Visit: Payer: Self-pay

## 2020-02-17 ENCOUNTER — Other Ambulatory Visit: Payer: Self-pay

## 2020-02-17 ENCOUNTER — Encounter (HOSPITAL_BASED_OUTPATIENT_CLINIC_OR_DEPARTMENT_OTHER): Payer: Self-pay | Admitting: *Deleted

## 2020-02-17 ENCOUNTER — Encounter (HOSPITAL_BASED_OUTPATIENT_CLINIC_OR_DEPARTMENT_OTHER): Payer: Self-pay

## 2020-02-17 ENCOUNTER — Emergency Department (HOSPITAL_BASED_OUTPATIENT_CLINIC_OR_DEPARTMENT_OTHER)
Admission: EM | Admit: 2020-02-17 | Discharge: 2020-02-17 | Disposition: A | Discharge status: Home / Self Care | Payer: Medicaid Other | Attending: Emergency Medicine | Admitting: Emergency Medicine

## 2020-02-17 DIAGNOSIS — L03211 Cellulitis of face: Secondary | ICD-10-CM | POA: Insufficient documentation

## 2020-02-17 DIAGNOSIS — F17210 Nicotine dependence, cigarettes, uncomplicated: Secondary | ICD-10-CM | POA: Insufficient documentation

## 2020-02-17 DIAGNOSIS — Z5321 Procedure and treatment not carried out due to patient leaving prior to being seen by health care provider: Secondary | ICD-10-CM | POA: Insufficient documentation

## 2020-02-17 DIAGNOSIS — R221 Localized swelling, mass and lump, neck: Secondary | ICD-10-CM | POA: Insufficient documentation

## 2020-02-17 DIAGNOSIS — R509 Fever, unspecified: Secondary | ICD-10-CM | POA: Insufficient documentation

## 2020-02-17 DIAGNOSIS — Z20822 Contact with and (suspected) exposure to covid-19: Secondary | ICD-10-CM | POA: Insufficient documentation

## 2020-02-17 NOTE — ED Triage Notes (Signed)
'  bump' under chin with redness, swelling, itching and pain.

## 2020-02-17 NOTE — ED Triage Notes (Addendum)
[facility] report- pt with redness/swelling below chin/meck-pt states she felt a pinch last night-unsure if insect/spider bite-pt airway inact-NAD-ambulatory to ED WR-pt agrees to EMS report-NAD-steady gait

## 2020-02-18 ENCOUNTER — Encounter (HOSPITAL_BASED_OUTPATIENT_CLINIC_OR_DEPARTMENT_OTHER): Payer: Self-pay | Admitting: Emergency Medicine

## 2020-02-18 ENCOUNTER — Emergency Department (HOSPITAL_BASED_OUTPATIENT_CLINIC_OR_DEPARTMENT_OTHER)
Admission: EM | Admit: 2020-02-18 | Discharge: 2020-02-18 | Disposition: A | Discharge status: Home / Self Care | Payer: Self-pay | Attending: Emergency Medicine | Admitting: Emergency Medicine

## 2020-02-18 ENCOUNTER — Emergency Department (HOSPITAL_BASED_OUTPATIENT_CLINIC_OR_DEPARTMENT_OTHER): Payer: Self-pay

## 2020-02-18 DIAGNOSIS — L03211 Cellulitis of face: Secondary | ICD-10-CM

## 2020-02-18 HISTORY — DX: Other psychoactive substance abuse, uncomplicated: F19.10

## 2020-02-18 LAB — CBC WITH DIFFERENTIAL/PLATELET
Abs Immature Granulocytes: 0.03 10*3/uL (ref 0.00–0.07)
Basophils Absolute: 0 10*3/uL (ref 0.0–0.1)
Basophils Relative: 0 %
Eosinophils Absolute: 0.2 10*3/uL (ref 0.0–0.5)
Eosinophils Relative: 2 %
HCT: 31.7 % — ABNORMAL LOW (ref 36.0–46.0)
Hemoglobin: 9.9 g/dL — ABNORMAL LOW (ref 12.0–15.0)
Immature Granulocytes: 0 %
Lymphocytes Relative: 27 %
Lymphs Abs: 3.1 10*3/uL (ref 0.7–4.0)
MCH: 25.2 pg — ABNORMAL LOW (ref 26.0–34.0)
MCHC: 31.2 g/dL (ref 30.0–36.0)
MCV: 80.7 fL (ref 80.0–100.0)
Monocytes Absolute: 1.2 10*3/uL — ABNORMAL HIGH (ref 0.1–1.0)
Monocytes Relative: 11 %
Neutro Abs: 6.9 10*3/uL (ref 1.7–7.7)
Neutrophils Relative %: 60 %
Platelets: 234 10*3/uL (ref 150–400)
RBC: 3.93 MIL/uL (ref 3.87–5.11)
RDW: 15.9 % — ABNORMAL HIGH (ref 11.5–15.5)
WBC: 11.4 10*3/uL — ABNORMAL HIGH (ref 4.0–10.5)
nRBC: 0 % (ref 0.0–0.2)

## 2020-02-18 LAB — BASIC METABOLIC PANEL
Anion gap: 8 (ref 5–15)
BUN: 10 mg/dL (ref 6–20)
CO2: 27 mmol/L (ref 22–32)
Calcium: 8.8 mg/dL — ABNORMAL LOW (ref 8.9–10.3)
Chloride: 102 mmol/L (ref 98–111)
Creatinine, Ser: 0.45 mg/dL (ref 0.44–1.00)
GFR, Estimated: >60 mL/min (ref >60)
Glucose, Bld: 93 mg/dL (ref 70–99)
Potassium: 4 mmol/L (ref 3.5–5.1)
Sodium: 137 mmol/L (ref 135–145)

## 2020-02-18 LAB — SARS CORONAVIRUS 2 BY RT PCR (HOSPITAL ORDER, PERFORMED IN ~~LOC~~ HOSPITAL LAB): SARS Coronavirus 2: NEGATIVE

## 2020-02-18 LAB — PREGNANCY, URINE: Preg Test, Ur: NEGATIVE

## 2020-02-18 MED ORDER — SODIUM CHLORIDE 0.9 % IV SOLN: INTRAVENOUS | Status: DC | PRN

## 2020-02-18 MED ORDER — CLINDAMYCIN HCL 300 MG PO CAPS: 300.0000 mg | ORAL_CAPSULE | Freq: Four times a day (QID) | ORAL | 0 refills | Status: DC

## 2020-02-18 MED ORDER — VANCOMYCIN HCL IN DEXTROSE 1-5 GM/200ML-% IV SOLN
1000.0000 mg | Freq: Once | INTRAVENOUS | Status: AC
  Administered 2020-02-18: 1000 mg via INTRAVENOUS
  Filled 2020-02-18: qty 200

## 2020-02-18 MED ORDER — IBUPROFEN 400 MG PO TABS
600.0000 mg | ORAL_TABLET | Freq: Once | ORAL | Status: AC
  Administered 2020-02-18: 600 mg via ORAL
  Filled 2020-02-18: qty 1

## 2020-02-18 MED ORDER — FENTANYL CITRATE (PF) 100 MCG/2ML IJ SOLN
100.0000 ug | Freq: Once | INTRAMUSCULAR | Status: AC
  Administered 2020-02-18: 100 ug via INTRAVENOUS
  Filled 2020-02-18: qty 2

## 2020-02-18 MED ORDER — OXYCODONE HCL 5 MG PO CAPS: 5.0000 mg | ORAL_CAPSULE | ORAL | 0 refills | Status: DC | PRN

## 2020-02-18 MED ORDER — IOHEXOL 300 MG/ML  SOLN
75.0000 mL | Freq: Once | INTRAMUSCULAR | Status: AC | PRN
  Administered 2020-02-18: 75 mL via INTRAVENOUS

## 2020-02-18 MED ORDER — FLUCONAZOLE 150 MG PO TABS
ORAL_TABLET | ORAL | 0 refills | Status: DC
Start: 2020-02-18 — End: 2020-03-22

## 2020-02-18 MED ORDER — ONDANSETRON HCL 4 MG/2ML IJ SOLN
4.0000 mg | Freq: Once | INTRAMUSCULAR | Status: AC
  Administered 2020-02-18: 4 mg via INTRAVENOUS
  Filled 2020-02-18: qty 2

## 2020-02-18 MED ORDER — SODIUM CHLORIDE 0.9 % IV SOLN
3.0000 g | Freq: Once | INTRAVENOUS | Status: AC
  Administered 2020-02-18: 3 g via INTRAVENOUS
  Filled 2020-02-18: qty 8

## 2020-02-18 NOTE — ED Provider Notes (Addendum)
[facility] Provider Note: [name] Dell, MD, FACEP  CSN: [id number] MRN: [id number] ARRIVAL: 02/17/20 at 2143 ROOM: MH09/MH09   CHIEF COMPLAINT  Abscess   HISTORY OF PRESENT ILLNESS  02/18/20 4:30 AM [name] [name] is a 30 y.o. female with 3 days of pain and swelling underneath her mandible.  She does not know what may have triggered this.  She is not aware of being bitten or scratched by anything.  There is a superficial abrasion associated with this area but does not know if this preceded the onset or if she scratched it in her sleep later.  She rates associated pain is a 10 out of 10, worse with palpation or movement.  She has had a subjective fever.  She also has itching of her upper chest.   Past Medical History:  Diagnosis Date  . Cervical cyst   . Menorrhagia   . No pertinent past medical history   . Polysubstance abuse [facility])     Past Surgical History:  Procedure Laterality Date  . DILATION AND CURETTAGE OF UTERUS     TAB     Family History  Problem Relation Age of Onset  . Stroke Mother   . Cancer Maternal Grandmother   . Other Neg Hx     Social History   Tobacco Use  . Smoking status: Current Every Day Smoker    Packs/day: 0.25    Years: 8.00    Pack years: 2.00    Types: Cigarettes  . Smokeless tobacco: Never Used  Substance Use Topics  . Alcohol use: Yes    Comment: occ  . Drug use: Yes    Types: Marijuana    Prior to Admission medications   Medication Sig Start Date End Date Taking? Authorizing Provider  clindamycin (CLEOCIN) 300 MG capsule Take 1 capsule (300 mg total) by mouth 4 (four) times daily. X 7 days 02/18/20  Yes Joyleen Haselton, MD  fluconazole (DIFLUCAN) 150 MG tablet Take 1 tablet as needed for vaginal yeast infection.  May repeat in 3 days if symptoms persist. 02/18/20  Yes Tabia Landowski, MD  oxycodone (OXY-IR) 5 MG capsule Take 1 capsule (5 mg total) by mouth every 4 (four) hours as needed. 02/18/20  Yes Sian Joles, MD   ferrous gluconate (FERGON) 324 MG tablet Take 1 tablet (324 mg total) by mouth daily with breakfast. 05/01/19   Narda Bonds, MD    Allergies Coconut fatty acids and Acetaminophen   REVIEW OF SYSTEMS  Negative except as noted here or in the History of Present Illness.   PHYSICAL EXAMINATION  Initial Vital Signs Blood pressure (!) 128/92, pulse 68, temperature 98.7 F (37.1 C), resp. rate 16, height 5\' 4"  (1.626 m), weight 71.2 kg, last menstrual period 02/12/2020, SpO2 99 %.  Examination General: Well-developed, well-nourished female in no acute distress; appearance consistent with age of record HENT: normocephalic; tenderness, swelling, erythema and induration of the submandibular region with overlying abrasion:    Eyes: pupils equal, round and reactive to light; extraocular muscles intact Neck: supple; no lymphadenopathy Heart: regular rate and rhythm Lungs: clear to auscultation bilaterally Abdomen: soft; nondistended; nontender; bowel sounds present Extremities: No deformity; full range of motion; pulses normal Neurologic: Awake, alert and oriented; motor function intact in all extremities and symmetric; no facial droop Skin: Warm and dry Psychiatric: Flat affect   RESULTS  Summary of this visit's results, reviewed and interpreted by myself:   EKG Interpretation  Date/Time:    Ventricular Rate:  PR Interval:    QRS Duration:   QT Interval:    QTC Calculation:   R Axis:     Text Interpretation:        Laboratory Studies: Results for orders placed or performed during the hospital encounter of 02/18/20 (from the past 24 hour(s))  SARS Coronavirus 2 by RT PCR (hospital order, performed in [facility] hospital lab) Nasopharyngeal Nasopharyngeal Swab     Status: None   Collection Time: 02/18/20  4:43 AM   Specimen: Nasopharyngeal Swab  Result Value Ref Range   SARS Coronavirus 2 NEGATIVE NEGATIVE  CBC with Differential/Platelet     Status: Abnormal    Collection Time: 02/18/20  5:05 AM  Result Value Ref Range   WBC 11.4 (H) 4.0 - 10.5 K/uL   RBC 3.93 3.87 - 5.11 MIL/uL   Hemoglobin 9.9 (L) 12.0 - 15.0 g/dL   HCT 23.5 (L) 36.1 - 44.3 %   MCV 80.7 80.0 - 100.0 fL   MCH 25.2 (L) 26.0 - 34.0 pg   MCHC 31.2 30.0 - 36.0 g/dL   RDW 15.4 (H) 00.8 - 67.6 %   Platelets 234 150 - 400 K/uL   nRBC 0.0 0.0 - 0.2 %   Neutrophils Relative % 60 %   Neutro Abs 6.9 1.7 - 7.7 K/uL   Lymphocytes Relative 27 %   Lymphs Abs 3.1 0.7 - 4.0 K/uL   Monocytes Relative 11 %   Monocytes Absolute 1.2 (H) 0.1 - 1.0 K/uL   Eosinophils Relative 2 %   Eosinophils Absolute 0.2 0.0 - 0.5 K/uL   Basophils Relative 0 %   Basophils Absolute 0.0 0.0 - 0.1 K/uL   Immature Granulocytes 0 %   Abs Immature Granulocytes 0.03 0.00 - 0.07 K/uL  Basic metabolic panel     Status: Abnormal   Collection Time: 02/18/20  5:05 AM  Result Value Ref Range   Sodium 137 135 - 145 mmol/L   Potassium 4.0 3.5 - 5.1 mmol/L   Chloride 102 98 - 111 mmol/L   CO2 27 22 - 32 mmol/L   Glucose, Bld 93 70 - 99 mg/dL   BUN 10 6 - 20 mg/dL   Creatinine, Ser 1.95 0.44 - 1.00 mg/dL   Calcium 8.8 (L) 8.9 - 10.3 mg/dL   GFR, Estimated >09 >32 mL/min   Anion gap 8 5 - 15  Pregnancy, urine     Status: None   Collection Time: 02/18/20  5:05 AM  Result Value Ref Range   Preg Test, Ur NEGATIVE NEGATIVE   Imaging Studies: CT Soft Tissue Neck W Contrast  Result Date: 02/18/2020 CLINICAL DATA:  Neck cellulitis, evaluate for Ludwig's angina EXAM: CT NECK WITH CONTRAST TECHNIQUE: Multidetector CT imaging of the neck was performed using the standard protocol following the bolus administration of intravenous contrast. CONTRAST:  63mL OMNIPAQUE IOHEXOL 300 MG/ML  SOLN COMPARISON:  08/22/2008 FINDINGS: Pharynx and larynx: Tonsillar hypertrophy, especially palatine which touch in the midline at the uvula. No submucosal edema or purulence seen. Salivary glands: No primary inflammation is seen. Inflammation does  secondarily encompass the nonenlarged left submandibular gland. Thyroid: Normal Lymph nodes: Enlarged jugular, submandibular, and submental lymph nodes attributed to the active inflammation. No cavitation noted. Vascular: Negative.  No major venous occlusion/thrombosis. Limited intracranial: Negative Visualized orbits: Prominent lacrimal gland size which is symmetric and without nodularity. Mastoids and visualized paranasal sinuses: Clear Skeleton: Bilateral mandibular cavities and periapical erosions but no immediately adjacent inflammation to imply an odontogenic cause  of inflammation. The left lower remaining molar is particularly eroded Upper chest: Negative Other: Fat stranding and skin thickening centered on the lower left chin.No rim enhancing collection. No soft tissue emphysema. No floor of mouth or other deep space extension seen. IMPRESSION: 1. Cellulitis over the chin without abscess or floor of mouth extension. 2. Dental caries and periapical erosions without odontogenic cellulitis. 3. Cervical adenitis and tonsillar thickening. Electronically Signed   By: Marnee Spring M.D.   On: 02/18/2020 06:19    ED COURSE and MDM  Nursing notes, initial and subsequent vitals signs, including pulse oximetry, reviewed and interpreted by myself.  Vitals:   02/17/20 2206 02/18/20 0315 02/18/20 0400 02/18/20 0548  BP: (!) 147/98 (!) 128/92 108/70 104/69  Pulse: 92 68 77 72  Resp: 18 16 18 18   Temp: 98.5 F (36.9 C) 98.7 F (37.1 C)    TempSrc: Oral     SpO2: 100% 99% 97% 99%  Weight:      Height:       Medications  vancomycin (VANCOCIN) IVPB 1000 mg/200 mL premix (1,000 mg Intravenous New Bag/Given 02/18/20 0640)  0.9 %  sodium chloride infusion ( Intravenous New Bag/Given 02/18/20 0529)  ondansetron (ZOFRAN) injection 4 mg (4 mg Intravenous Given 02/18/20 0530)  fentaNYL (SUBLIMAZE) injection 100 mcg (100 mcg Intravenous Given 02/18/20 0531)  Ampicillin-Sulbactam (UNASYN) 3 g in sodium chloride 0.9 %  100 mL IVPB ( Intravenous Stopped 02/18/20 0621)  iohexol (OMNIPAQUE) 300 MG/ML solution 75 mL (75 mLs Intravenous Contrast Given 02/18/20 0554)   6:44 AM Unasyn and vancomycin given prior to CT scan for suspected Ludewig's angina.  CT scan shows soft tissue cellulitis without abscess or intraoral involvement.  We will treat with clindamycin for facial cellulitis and have her return if symptoms worsen.   PROCEDURES  Procedures   ED DIAGNOSES     ICD-10-CM   1. Cellulitis of face  04/17/20        J49.702, MD 02/18/20 04/17/20    [id number], MD 02/18/20 [id number]

## 2020-03-22 ENCOUNTER — Encounter (HOSPITAL_BASED_OUTPATIENT_CLINIC_OR_DEPARTMENT_OTHER): Payer: Self-pay

## 2020-03-22 ENCOUNTER — Other Ambulatory Visit: Payer: Self-pay

## 2020-03-22 ENCOUNTER — Emergency Department (HOSPITAL_BASED_OUTPATIENT_CLINIC_OR_DEPARTMENT_OTHER)
Admission: EM | Admit: 2020-03-22 | Discharge: 2020-03-22 | Disposition: A | Discharge status: Home / Self Care | Payer: Medicaid Other | Attending: Emergency Medicine | Admitting: Emergency Medicine

## 2020-03-22 DIAGNOSIS — R0602 Shortness of breath: Secondary | ICD-10-CM | POA: Insufficient documentation

## 2020-03-22 DIAGNOSIS — R42 Dizziness and giddiness: Secondary | ICD-10-CM | POA: Insufficient documentation

## 2020-03-22 DIAGNOSIS — F17210 Nicotine dependence, cigarettes, uncomplicated: Secondary | ICD-10-CM | POA: Insufficient documentation

## 2020-03-22 DIAGNOSIS — R112 Nausea with vomiting, unspecified: Secondary | ICD-10-CM | POA: Insufficient documentation

## 2020-03-22 LAB — URINALYSIS, ROUTINE W REFLEX MICROSCOPIC
Bilirubin Urine: NEGATIVE
Glucose, UA: NEGATIVE mg/dL
Hgb urine dipstick: NEGATIVE
Ketones, ur: NEGATIVE mg/dL
Leukocytes,Ua: NEGATIVE
Nitrite: NEGATIVE
Protein, ur: NEGATIVE mg/dL
Specific Gravity, Urine: 1.015 (ref 1.005–1.030)
pH: 8 (ref 5.0–8.0)

## 2020-03-22 LAB — CBC WITH DIFFERENTIAL/PLATELET
Abs Immature Granulocytes: 0 10*3/uL (ref 0.00–0.07)
Basophils Absolute: 0 10*3/uL (ref 0.0–0.1)
Basophils Relative: 1 %
Eosinophils Absolute: 0.1 10*3/uL (ref 0.0–0.5)
Eosinophils Relative: 2 %
HCT: 34.8 % — ABNORMAL LOW (ref 36.0–46.0)
Hemoglobin: 10.8 g/dL — ABNORMAL LOW (ref 12.0–15.0)
Immature Granulocytes: 0 %
Lymphocytes Relative: 47 %
Lymphs Abs: 2.9 10*3/uL (ref 0.7–4.0)
MCH: 25.2 pg — ABNORMAL LOW (ref 26.0–34.0)
MCHC: 31 g/dL (ref 30.0–36.0)
MCV: 81.3 fL (ref 80.0–100.0)
Monocytes Absolute: 0.6 10*3/uL (ref 0.1–1.0)
Monocytes Relative: 10 %
Neutro Abs: 2.4 10*3/uL (ref 1.7–7.7)
Neutrophils Relative %: 40 %
Platelets: 212 10*3/uL (ref 150–400)
RBC: 4.28 MIL/uL (ref 3.87–5.11)
RDW: 16.6 % — ABNORMAL HIGH (ref 11.5–15.5)
WBC: 6 10*3/uL (ref 4.0–10.5)
nRBC: 0 % (ref 0.0–0.2)

## 2020-03-22 LAB — PREGNANCY, URINE: Preg Test, Ur: NEGATIVE

## 2020-03-22 LAB — BASIC METABOLIC PANEL
Anion gap: 8 (ref 5–15)
BUN: 8 mg/dL (ref 6–20)
CO2: 25 mmol/L (ref 22–32)
Calcium: 9.1 mg/dL (ref 8.9–10.3)
Chloride: 103 mmol/L (ref 98–111)
Creatinine, Ser: 0.5 mg/dL (ref 0.44–1.00)
GFR, Estimated: >60 mL/min (ref >60)
Glucose, Bld: 93 mg/dL (ref 70–99)
Potassium: 3.5 mmol/L (ref 3.5–5.1)
Sodium: 136 mmol/L (ref 135–145)

## 2020-03-22 MED ORDER — SODIUM CHLORIDE 0.9 % IV BOLUS
1000.0000 mL | Freq: Once | INTRAVENOUS | Status: AC
  Administered 2020-03-22: 1000 mL via INTRAVENOUS

## 2020-03-22 MED ORDER — ONDANSETRON HCL 4 MG/2ML IJ SOLN
4.0000 mg | Freq: Once | INTRAMUSCULAR | Status: AC
  Administered 2020-03-22: 4 mg via INTRAVENOUS
  Filled 2020-03-22: qty 2

## 2020-03-22 MED ORDER — DIPHENHYDRAMINE HCL 50 MG/ML IJ SOLN
25.0000 mg | Freq: Once | INTRAMUSCULAR | Status: AC
  Administered 2020-03-22: 25 mg via INTRAVENOUS
  Filled 2020-03-22: qty 1

## 2020-03-22 MED ORDER — MECLIZINE HCL 25 MG PO TABS: 25.0000 mg | ORAL_TABLET | Freq: Three times a day (TID) | ORAL | 0 refills | Status: AC | PRN

## 2020-03-22 NOTE — ED Triage Notes (Signed)
Pt Here POV from Home with Multiple Complaints.  Pt began experiencing Dizziness approx 1 week ago. Dizziness worsened and SOB began a few ago. Yesterday, the patient began experiencing Nausea and Vomiting due to the Dizziness.   Patient states last year she experienced the same symptoms and fainted and required Blood Transfusions due to Anemia. No recent bleeding (patient had her Menstrual Cycle about a week ago for which she states is usually heavy).   Patient ambulatory but weak and dizzy. A&Ox4.

## 2020-03-22 NOTE — ED Provider Notes (Signed)
[facility] Provider Note: Lowella Dell, MD, FACEP  CSN: [id number] MRN: [id number] ARRIVAL: 03/22/20 at 0153 ROOM: MH06/MH06   CHIEF COMPLAINT  Dizziness   HISTORY OF PRESENT ILLNESS  03/22/20 2:19 AM [name] [name] is a 30 y.o. female who has had dizziness for about a week.  She characterizes the dizziness as a sensation of being off balance or that she herself is spinning.  There is also a sensation that she might pass out although she has not done so.  This dizziness occurs in episodes that last typically only minutes.  They are worse when standing or with movement of her head.  She gets short of breath with these episodes and yesterday began having nausea and vomiting with them.  She estimates she has vomited 4-5 times.  She has a history of anemia in the past that caused similar symptoms.   Past Medical History:  Diagnosis Date  . Cervical cyst   . Menorrhagia   . Polysubstance abuse [facility])     Past Surgical History:  Procedure Laterality Date  . DILATION AND CURETTAGE OF UTERUS     TAB     Family History  Problem Relation Age of Onset  . Stroke Mother   . Cancer Maternal Grandmother   . Other Neg Hx     Social History   Tobacco Use  . Smoking status: Current Every Day Smoker    Packs/day: 0.25    Years: 8.00    Pack years: 2.00    Types: Cigarettes  . Smokeless tobacco: Never Used  Substance Use Topics  . Alcohol use: Yes    Comment: occ  . Drug use: Yes    Types: Marijuana    Comment: Heroin (twice daily/intranasal)    Prior to Admission medications   Medication Sig Start Date End Date Taking? Authorizing Provider  meclizine (ANTIVERT) 25 MG tablet Take 1 tablet (25 mg total) by mouth 3 (three) times daily as needed for dizziness. 03/22/20  Yes Tilford Deaton, MD  ferrous gluconate (FERGON) 324 MG tablet Take 1 tablet (324 mg total) by mouth daily with breakfast. 05/01/19   Narda Bonds, MD    Allergies Coconut fatty acids and  Acetaminophen   REVIEW OF SYSTEMS  Negative except as noted here or in the History of Present Illness.   PHYSICAL EXAMINATION  Initial Vital Signs Blood pressure (!) 137/95, pulse 72, temperature 98.5 F (36.9 C), temperature source Oral, resp. rate 12, height 5\' 5"  (1.651 m), weight 65.8 kg, SpO2 100 %.  Examination General: Well-developed, well-nourished female in no acute distress; appearance consistent with age of record HENT: normocephalic; atraumatic; TMs normal Eyes: pupils equal, round and reactive to light; extraocular muscles intact; no nystagmus Neck: supple Heart: regular rate and rhythm; holosystolic murmur at the left upper sternal border Lungs: clear to auscultation bilaterally Abdomen: soft; nondistended; nontender; bowel sounds present Extremities: No deformity; full range of motion; pulses normal Neurologic: Awake, alert and oriented; motor function intact in all extremities and symmetric; no facial droop Skin: Warm and dry Psychiatric: Normal mood and affect   RESULTS  Summary of this visit's results, reviewed and interpreted by myself:   EKG Interpretation  Date/Time:    Ventricular Rate:    PR Interval:    QRS Duration:   QT Interval:    QTC Calculation:   R Axis:     Text Interpretation:        Laboratory Studies: Results for orders placed or performed during  the hospital encounter of 03/22/20 (from the past 24 hour(s))  CBC with Differential/Platelet     Status: Abnormal   Collection Time: 03/22/20  2:15 AM  Result Value Ref Range   WBC 6.0 4.0 - 10.5 K/uL   RBC 4.28 3.87 - 5.11 MIL/uL   Hemoglobin 10.8 (L) 12.0 - 15.0 g/dL   HCT 29.9 (L) 24.2 - 68.3 %   MCV 81.3 80.0 - 100.0 fL   MCH 25.2 (L) 26.0 - 34.0 pg   MCHC 31.0 30.0 - 36.0 g/dL   RDW 41.9 (H) 62.2 - 29.7 %   Platelets 212 150 - 400 K/uL   nRBC 0.0 0.0 - 0.2 %   Neutrophils Relative % 40 %   Neutro Abs 2.4 1.7 - 7.7 K/uL   Lymphocytes Relative 47 %   Lymphs Abs 2.9 0.7 - 4.0  K/uL   Monocytes Relative 10 %   Monocytes Absolute 0.6 0.1 - 1.0 K/uL   Eosinophils Relative 2 %   Eosinophils Absolute 0.1 0.0 - 0.5 K/uL   Basophils Relative 1 %   Basophils Absolute 0.0 0.0 - 0.1 K/uL   Immature Granulocytes 0 %   Abs Immature Granulocytes 0.00 0.00 - 0.07 K/uL  Basic metabolic panel     Status: None   Collection Time: 03/22/20  2:15 AM  Result Value Ref Range   Sodium 136 135 - 145 mmol/L   Potassium 3.5 3.5 - 5.1 mmol/L   Chloride 103 98 - 111 mmol/L   CO2 25 22 - 32 mmol/L   Glucose, Bld 93 70 - 99 mg/dL   BUN 8 6 - 20 mg/dL   Creatinine, Ser 9.89 0.44 - 1.00 mg/dL   Calcium 9.1 8.9 - 21.1 mg/dL   GFR, Estimated >94 >17 mL/min   Anion gap 8 5 - 15  Urinalysis, Routine w reflex microscopic     Status: None   Collection Time: 03/22/20  2:23 AM  Result Value Ref Range   Color, Urine YELLOW YELLOW   APPearance CLEAR CLEAR   Specific Gravity, Urine 1.015 1.005 - 1.030   pH 8.0 5.0 - 8.0   Glucose, UA NEGATIVE NEGATIVE mg/dL   Hgb urine dipstick NEGATIVE NEGATIVE   Bilirubin Urine NEGATIVE NEGATIVE   Ketones, ur NEGATIVE NEGATIVE mg/dL   Protein, ur NEGATIVE NEGATIVE mg/dL   Nitrite NEGATIVE NEGATIVE   Leukocytes,Ua NEGATIVE NEGATIVE  Pregnancy, urine     Status: None   Collection Time: 03/22/20  2:23 AM  Result Value Ref Range   Preg Test, Ur NEGATIVE NEGATIVE   Imaging Studies: No results found.  ED COURSE and MDM  Nursing notes, initial and subsequent vitals signs, including pulse oximetry, reviewed and interpreted by myself.  Vitals:   03/22/20 0213 03/22/20 0214 03/22/20 0300 03/22/20 0400  BP: (!) 137/95  (!) 123/111 130/89  Pulse: 72  73 68  Resp: 12  14 18   Temp: 98.5 F (36.9 C)     TempSrc: Oral     SpO2: 100%  100% 100%  Weight:  65.8 kg    Height:  5\' 5"  (1.651 m)     Medications  sodium chloride 0.9 % bolus 1,000 mL (0 mLs Intravenous Stopped 03/22/20 0408)  diphenhydrAMINE (BENADRYL) injection 25 mg (25 mg Intravenous Given  03/22/20 0246)  ondansetron (ZOFRAN) injection 4 mg (4 mg Intravenous Given 03/22/20 0246)   Patient's presentation consistent with peripheral vertigo.  She has a strong family history of the same.  We will treat  with meclizine.  She does not have a PCP and will give a referral number so that she can help establish with one.   PROCEDURES  Procedures   ED DIAGNOSES     ICD-10-CM   1. Vertigo  R42        Caroleen Stoermer, Jonny Ruiz, MD 03/22/20 [phone]

## 2020-05-04 IMAGING — CT CT MAXILLOFACIAL W/ CM
3 of 4 series · 16 of 47 positions shown, 19 images · IV contrast (omnipaque)
Comparison: None.

CLINICAL DATA: Left facial swelling and pain

EXAM:
CT MAXILLOFACIAL WITH CONTRAST
TECHNIQUE: Multidetector CT imaging of the maxillofacial structures was
performed with intravenous contrast. Multiplanar CT image
reconstructions were also generated.
CONTRAST:  75mL OMNIPAQUE IOHEXOL 300 MG/ML  SOLN

[Series 3: max soft · axial · 0.40mm/px · z∈[-2,+138]mm · 12 of 82 slices shown, 15 images]
[im 6/82  brain]
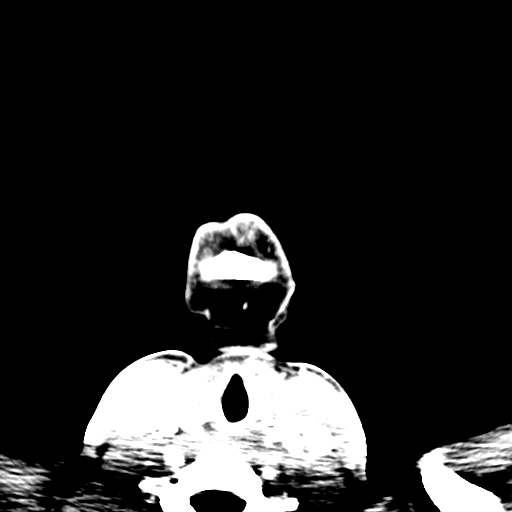
[im 6/82  bone]
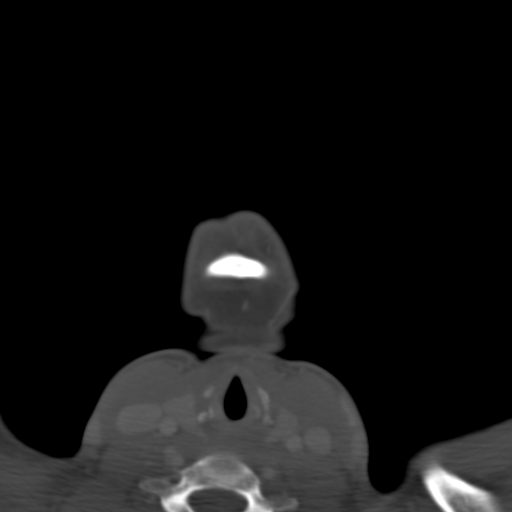
[im 12/82  bone]
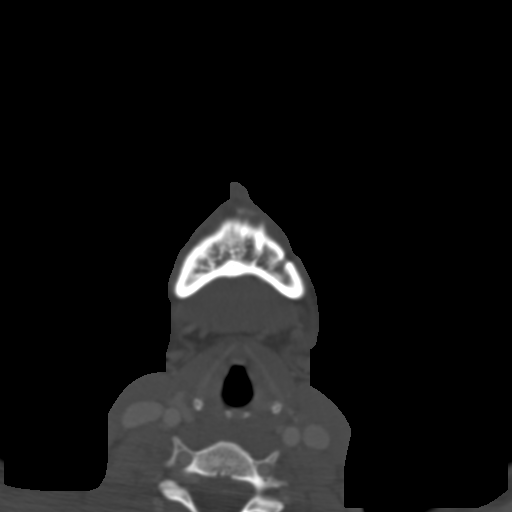
[im 17/82  bone]
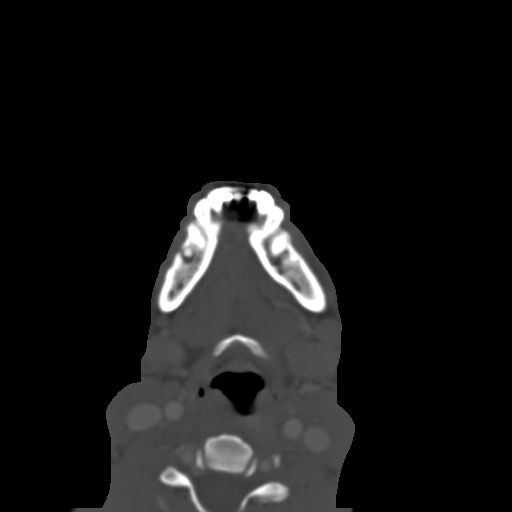
[im 26/82  bone]
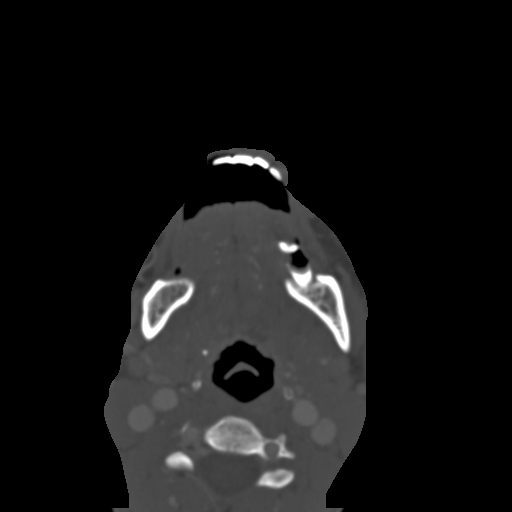
[im 31/82  brain]
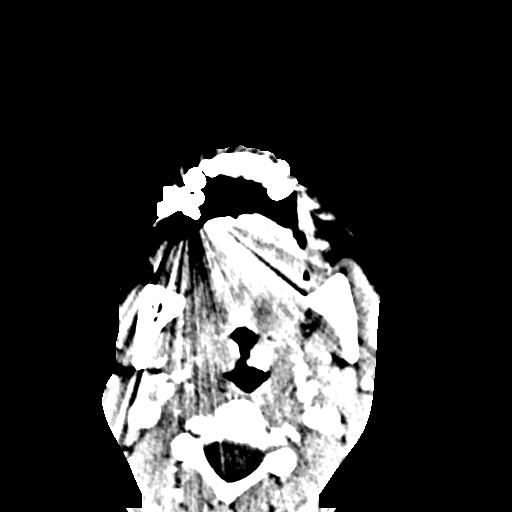
[im 31/82  bone]
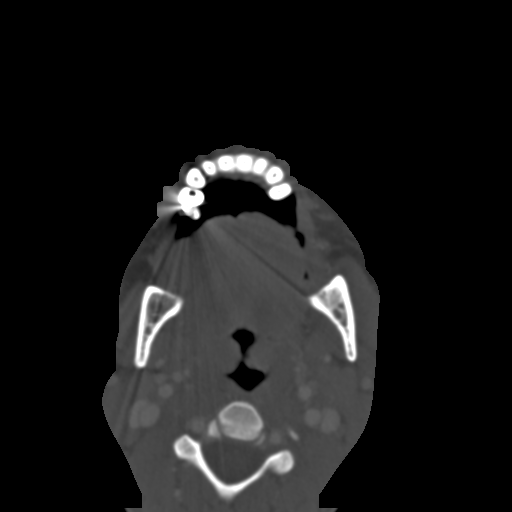
[im 37/82  bone]
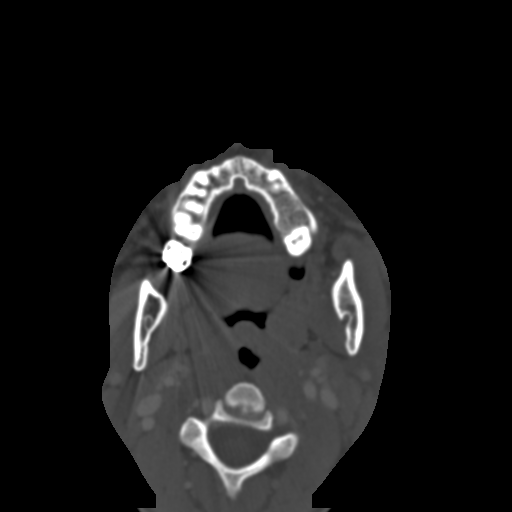
[im 45/82  bone]
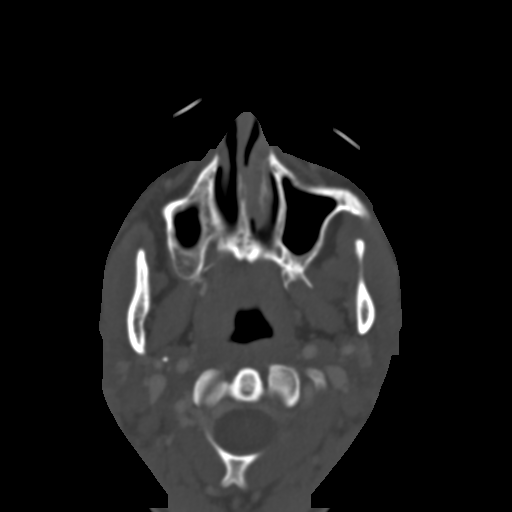
[im 51/82  bone]
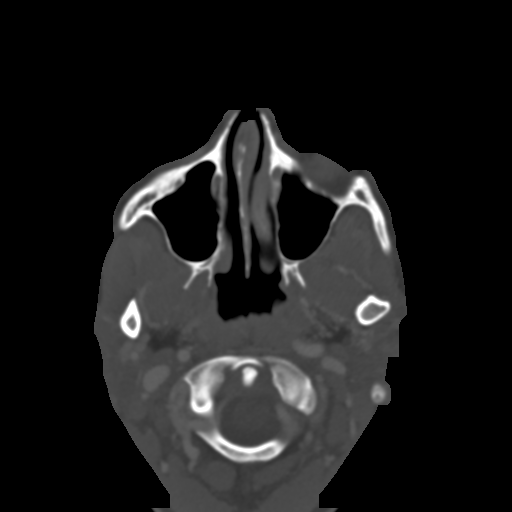
[im 56/82  brain]
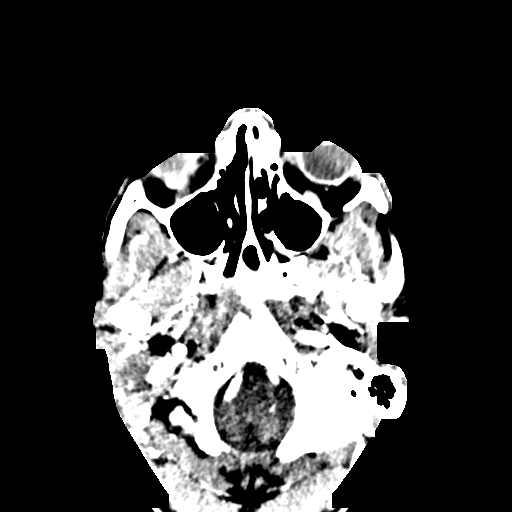
[im 56/82  bone]
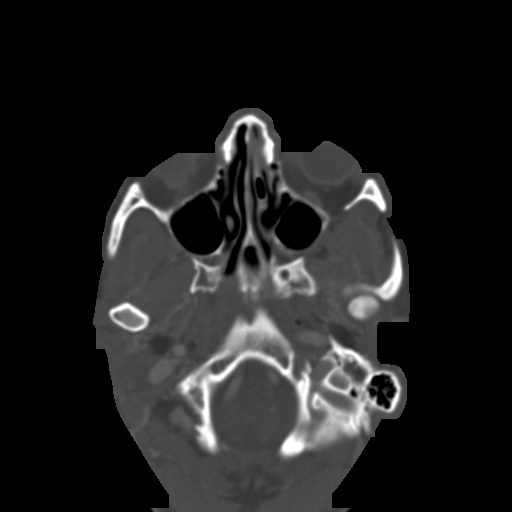
[im 65/82  bone]
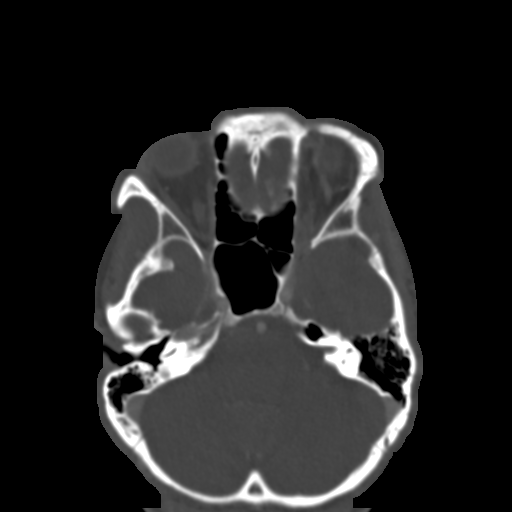
[im 70/82  bone]
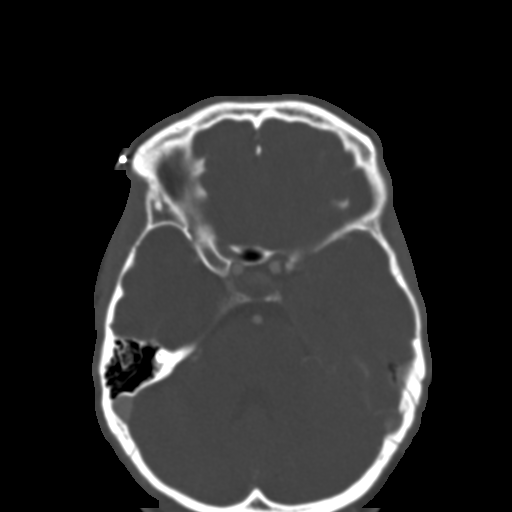
[im 76/82  bone]
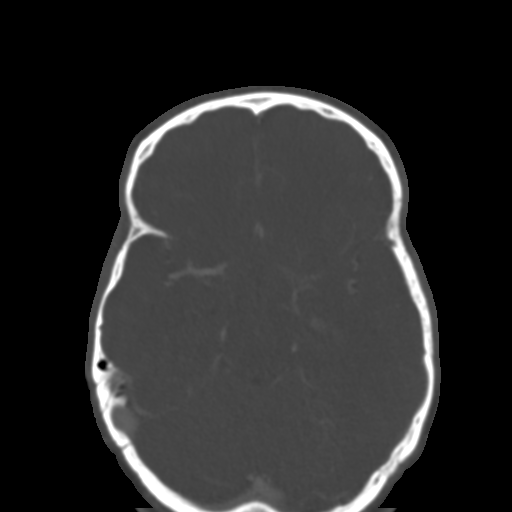

[Series 5: coronal soft · coronal · 0.34mm/px · 3 of 74 slices shown]
[im 25/74  bone]
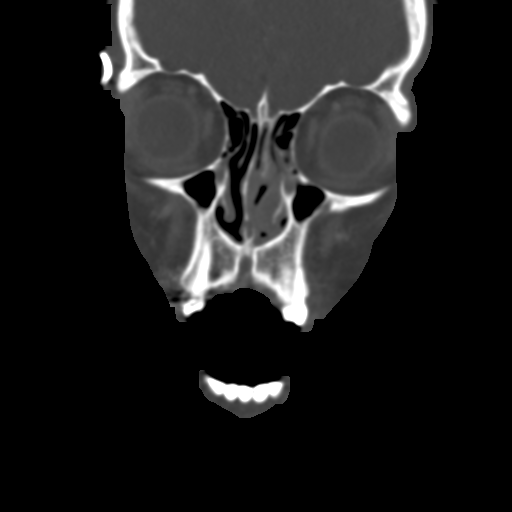
[im 33/74  bone]
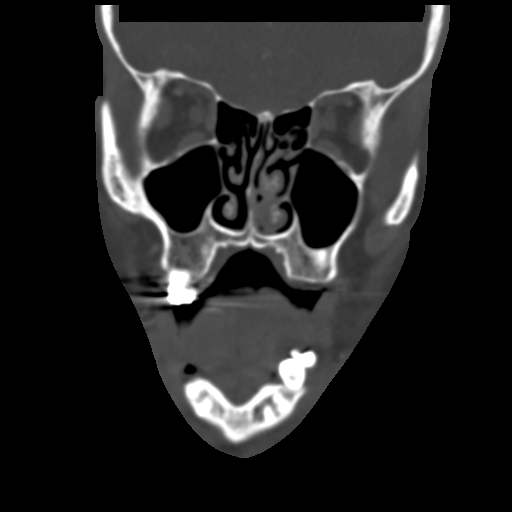
[im 41/74  bone]
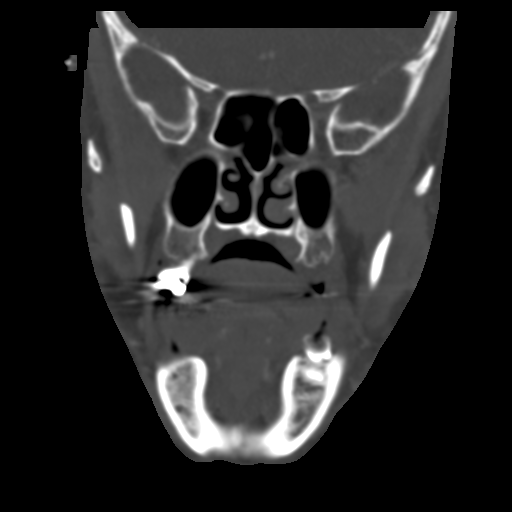

[Series 7: sagittal bone · sagittal · 0.31mm/px · 1 of 91 slices shown]
[im 46/91  bone]
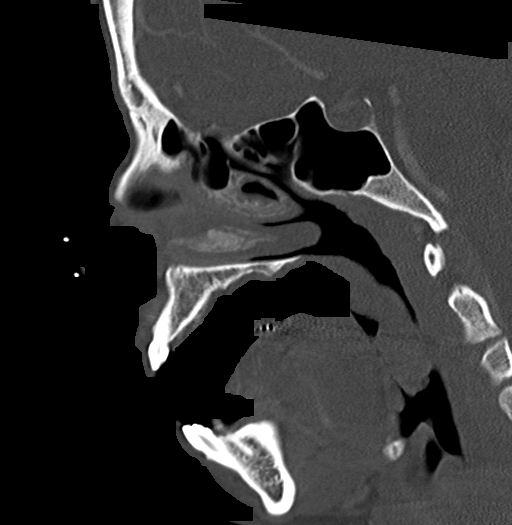

[16 of 47 positions shown; findings below may reference images not displayed]

FINDINGS: Osseous: Temporomandibular joints are unremarkable. There is no
acute facial fracture. Partially edentulous with evidence of tooth
decay including periapical lucency about the posterior most
remaining mandibular teeth bilaterally. No dehiscence of buccal or
lingual cortex.

Orbits: Unremarkable apart from incidental probable lacrimal gland
prolapse.

Sinuses: Unremarkable.

Soft tissues: No substantial soft tissue swelling or evidence of
abscess. Possible mild asymmetric soft tissue thickening along the
buccal aspect of the left mandible.

Limited intracranial: No abnormal enhancement.
IMPRESSION: No significant inflammatory changes or evidence of abscess
identified. There is dental disease including periapical lucency
about the most posterior remaining left mandibular tooth. Possible
mild asymmetric soft tissue thickening along the left mandible.

## 2020-09-29 ENCOUNTER — Ambulatory Visit: Payer: Medicaid Other | Admitting: Obstetrics

## 2021-12-06 ENCOUNTER — Encounter (HOSPITAL_COMMUNITY): Payer: Self-pay | Admitting: Internal Medicine

## 2021-12-06 ENCOUNTER — Observation Stay (HOSPITAL_COMMUNITY)

## 2021-12-06 ENCOUNTER — Observation Stay (HOSPITAL_COMMUNITY)
Admission: EM | Admit: 2021-12-06 | Discharge: 2021-12-08 | Disposition: A | Discharge status: Home / Self Care | Attending: Internal Medicine | Admitting: Internal Medicine

## 2021-12-06 ENCOUNTER — Emergency Department (HOSPITAL_COMMUNITY)

## 2021-12-06 DIAGNOSIS — F1910 Other psychoactive substance abuse, uncomplicated: Secondary | ICD-10-CM | POA: Diagnosis present

## 2021-12-06 DIAGNOSIS — E236 Other disorders of pituitary gland: Secondary | ICD-10-CM | POA: Diagnosis not present

## 2021-12-06 DIAGNOSIS — N390 Urinary tract infection, site not specified: Secondary | ICD-10-CM | POA: Insufficient documentation

## 2021-12-06 DIAGNOSIS — F17210 Nicotine dependence, cigarettes, uncomplicated: Secondary | ICD-10-CM | POA: Insufficient documentation

## 2021-12-06 DIAGNOSIS — R569 Unspecified convulsions: Secondary | ICD-10-CM | POA: Diagnosis present

## 2021-12-06 DIAGNOSIS — Z720 Tobacco use: Secondary | ICD-10-CM | POA: Diagnosis present

## 2021-12-06 DIAGNOSIS — Z79899 Other long term (current) drug therapy: Secondary | ICD-10-CM | POA: Insufficient documentation

## 2021-12-06 LAB — CBC WITH DIFFERENTIAL/PLATELET
Abs Immature Granulocytes: 0.02 10*3/uL (ref 0.00–0.07)
Basophils Absolute: 0 10*3/uL (ref 0.0–0.1)
Basophils Relative: 1 %
Eosinophils Absolute: 0 10*3/uL (ref 0.0–0.5)
Eosinophils Relative: 0 %
HCT: 38.1 % (ref 36.0–46.0)
Hemoglobin: 11.7 g/dL — ABNORMAL LOW (ref 12.0–15.0)
Immature Granulocytes: 0 %
Lymphocytes Relative: 30 %
Lymphs Abs: 2.6 10*3/uL (ref 0.7–4.0)
MCH: 24.5 pg — ABNORMAL LOW (ref 26.0–34.0)
MCHC: 30.7 g/dL (ref 30.0–36.0)
MCV: 79.9 fL — ABNORMAL LOW (ref 80.0–100.0)
Monocytes Absolute: 0.7 10*3/uL (ref 0.1–1.0)
Monocytes Relative: 8 %
Neutro Abs: 5.4 10*3/uL (ref 1.7–7.7)
Neutrophils Relative %: 61 %
Platelets: 345 10*3/uL (ref 150–400)
RBC: 4.77 MIL/uL (ref 3.87–5.11)
RDW: 15.5 % (ref 11.5–15.5)
WBC: 8.8 10*3/uL (ref 4.0–10.5)
nRBC: 0 % (ref 0.0–0.2)

## 2021-12-06 LAB — COMPREHENSIVE METABOLIC PANEL
ALT: 21 U/L (ref 0–44)
AST: 28 U/L (ref 15–41)
Albumin: 4 g/dL (ref 3.5–5.0)
Alkaline Phosphatase: 45 U/L (ref 38–126)
Anion gap: 10 (ref 5–15)
BUN: 13 mg/dL (ref 6–20)
CO2: 24 mmol/L (ref 22–32)
Calcium: 9.9 mg/dL (ref 8.9–10.3)
Chloride: 103 mmol/L (ref 98–111)
Creatinine, Ser: 0.88 mg/dL (ref 0.44–1.00)
GFR, Estimated: >60 mL/min (ref >60)
Glucose, Bld: 105 mg/dL — ABNORMAL HIGH (ref 70–99)
Potassium: 3.7 mmol/L (ref 3.5–5.1)
Sodium: 137 mmol/L (ref 135–145)
Total Bilirubin: 0.9 mg/dL (ref 0.3–1.2)
Total Protein: 7.5 g/dL (ref 6.5–8.1)

## 2021-12-06 LAB — RAPID URINE DRUG SCREEN, HOSP PERFORMED
Amphetamines: POSITIVE — AB
Barbiturates: NOT DETECTED
Benzodiazepines: POSITIVE — AB
Cocaine: NOT DETECTED
Opiates: NOT DETECTED
Tetrahydrocannabinol: NOT DETECTED

## 2021-12-06 LAB — I-STAT BETA HCG BLOOD, ED (MC, WL, AP ONLY): I-stat hCG, quantitative: <5 m[IU]/mL (ref <5)

## 2021-12-06 LAB — ETHANOL: Alcohol, Ethyl (B): <10 mg/dL (ref <10)

## 2021-12-06 LAB — CBG MONITORING, ED: Glucose-Capillary: 88 mg/dL (ref 70–99)

## 2021-12-06 MED ORDER — ACETAMINOPHEN 650 MG RE SUPP: 650.0000 mg | RECTAL | Status: DC | PRN

## 2021-12-06 MED ORDER — ONDANSETRON HCL 4 MG PO TABS: 4.0000 mg | ORAL_TABLET | Freq: Four times a day (QID) | ORAL | Status: DC | PRN

## 2021-12-06 MED ORDER — ONDANSETRON HCL 4 MG/2ML IJ SOLN
4.0000 mg | Freq: Once | INTRAMUSCULAR | Status: AC
  Administered 2021-12-06: 4 mg via INTRAVENOUS
  Filled 2021-12-06: qty 2

## 2021-12-06 MED ORDER — LORAZEPAM 2 MG/ML IJ SOLN: 4.0000 mg | INTRAMUSCULAR | Status: DC | PRN

## 2021-12-06 MED ORDER — ORAL CARE MOUTH RINSE
15.0000 mL | OROMUCOSAL | Status: DC
  Administered 2021-12-07 – 2021-12-08 (×6): 15 mL via OROMUCOSAL

## 2021-12-06 MED ORDER — LACTATED RINGERS IV SOLN: INTRAVENOUS | Status: DC

## 2021-12-06 MED ORDER — KETOROLAC TROMETHAMINE 30 MG/ML IJ SOLN
30.0000 mg | Freq: Four times a day (QID) | INTRAMUSCULAR | Status: DC | PRN
  Administered 2021-12-06 – 2021-12-07 (×2): 30 mg via INTRAVENOUS
  Filled 2021-12-06 (×2): qty 1

## 2021-12-06 MED ORDER — HYDRALAZINE HCL 20 MG/ML IJ SOLN: 5.0000 mg | INTRAMUSCULAR | Status: DC | PRN

## 2021-12-06 MED ORDER — SODIUM CHLORIDE 0.9% FLUSH
3.0000 mL | Freq: Two times a day (BID) | INTRAVENOUS | Status: DC
  Administered 2021-12-07 – 2021-12-08 (×3): 3 mL via INTRAVENOUS

## 2021-12-06 MED ORDER — ENOXAPARIN SODIUM 40 MG/0.4ML IJ SOSY
40.0000 mg | PREFILLED_SYRINGE | INTRAMUSCULAR | Status: DC
  Administered 2021-12-06 – 2021-12-08 (×3): 40 mg via SUBCUTANEOUS
  Filled 2021-12-06 (×4): qty 0.4

## 2021-12-06 MED ORDER — PROMETHAZINE HCL 12.5 MG RE SUPP
12.5000 mg | Freq: Four times a day (QID) | RECTAL | Status: DC | PRN
  Administered 2021-12-06: 12.5 mg via RECTAL
  Filled 2021-12-06: qty 2
  Filled 2021-12-06: qty 1

## 2021-12-06 MED ORDER — NICOTINE 14 MG/24HR TD PT24
14.0000 mg | MEDICATED_PATCH | Freq: Every day | TRANSDERMAL | Status: DC
  Administered 2021-12-06 – 2021-12-08 (×3): 14 mg via TRANSDERMAL
  Filled 2021-12-06 (×3): qty 1

## 2021-12-06 MED ORDER — LORAZEPAM 2 MG/ML IJ SOLN
1.0000 mg | Freq: Once | INTRAMUSCULAR | Status: AC
  Administered 2021-12-06: 1 mg via INTRAVENOUS
  Filled 2021-12-06: qty 1

## 2021-12-06 MED ORDER — ONDANSETRON HCL 4 MG/2ML IJ SOLN
4.0000 mg | Freq: Four times a day (QID) | INTRAMUSCULAR | Status: DC | PRN
  Administered 2021-12-06 – 2021-12-07 (×3): 4 mg via INTRAVENOUS
  Filled 2021-12-06 (×3): qty 2

## 2021-12-06 MED ORDER — ACETAMINOPHEN 325 MG PO TABS
650.0000 mg | ORAL_TABLET | ORAL | Status: DC | PRN
  Administered 2021-12-06: 650 mg via ORAL
  Filled 2021-12-06: qty 2

## 2021-12-06 MED ORDER — CHLORHEXIDINE GLUCONATE 0.12% ORAL RINSE (MEDLINE KIT)
15.0000 mL | Freq: Two times a day (BID) | OROMUCOSAL | Status: DC
  Administered 2021-12-08: 15 mL via OROMUCOSAL

## 2021-12-06 NOTE — ED Notes (Signed)
Pt placed on bedpan. Pt too lethargic to get out of bed.

## 2021-12-06 NOTE — ED Notes (Signed)
Pt c/o "my body hurts" and "my eyes feel like someone has ripped them apart" and "I'm nauseas", notified MD and primary RN

## 2021-12-06 NOTE — ED Notes (Signed)
Officer told this RN that pt vomited in the bed, upon going to clean up vomit, it was noted that it appeared like mucous. Gave pt an emesis bag.

## 2021-12-06 NOTE — ED Provider Notes (Signed)
[facility] EMERGENCY DEPARTMENT Provider Note   CSN: [id number] Arrival date & time: 12/06/21  6546     History  Chief Complaint  Patient presents with   Seizures    [name] [name] is a 31 y.o. female.  31 yo F with no prior history of seizures reports from jail for concern for seizure like activity.  Noted by EMS, reportedly assisted ventilations for 20 min prior to arrival.  Given midazolam by EMS.    Patient unable to provide history.  Level V caveat.    Seizures      Home Medications Prior to Admission medications   Medication Sig Start Date End Date Taking? Authorizing Provider  acetaminophen (TYLENOL) 325 MG tablet Take 650 mg by mouth 3 (three) times daily as needed for headache, mild pain or fever. 7 day course. Start date of 11.17.23, End Date of 11.24.23   Yes [provider]  loperamide (IMODIUM) 2 MG capsule Take 2 mg by mouth 3 (three) times daily as needed for diarrhea or loose stools. 7 day course. Start date of 11.17.23, End Date of 11.24.23   Yes [provider]  meclizine (ANTIVERT) 25 MG tablet Take 1 tablet (25 mg total) by mouth 3 (three) times daily as needed for dizziness. Patient taking differently: Take 25 mg by mouth 3 (three) times daily as needed for dizziness. 7 day course. Start date of 11.17.23, End Date of 11.24.23 03/22/20  Yes Molpus, John, MD      Allergies    Coconut fatty acids and Acetaminophen    Review of Systems   Review of Systems  Neurological:  Positive for seizures.    Physical Exam Updated Vital Signs BP (!) 157/104   Pulse (!) 59   Temp 99 F (37.2 C) (Oral)   Resp 18   Ht 5\' 5"  (1.651 m)   Wt 65.8 kg   SpO2 100%   BMI 24.14 kg/m  Physical Exam Vitals and nursing note reviewed.  Constitutional:      General: She is not in acute distress.    Appearance: She is well-developed. She is not diaphoretic.  HENT:     Head: Normocephalic and atraumatic.  Eyes:     Pupils: Pupils  are equal, round, and reactive to light.  Cardiovascular:     Rate and Rhythm: Normal rate and regular rhythm.     Heart sounds: No murmur heard.    No friction rub. No gallop.  Pulmonary:     Effort: Pulmonary effort is normal.     Breath sounds: No wheezing or rales.  Abdominal:     General: There is no distension.     Palpations: Abdomen is soft.     Tenderness: There is no abdominal tenderness.  Musculoskeletal:        General: No tenderness.     Cervical back: Normal range of motion and neck supple.  Skin:    General: Skin is warm and dry.  Neurological:     Mental Status: She is alert.     Comments: Sleepy, localizes to pain     ED Results / Procedures / Treatments   Labs (all labs ordered are listed, but only abnormal results are displayed) Labs Reviewed  COMPREHENSIVE METABOLIC PANEL - Abnormal; Notable for the following components:      Result Value   Glucose, Bld 105 (*)    All other components within normal limits  RAPID URINE DRUG SCREEN, HOSP PERFORMED - Abnormal; Notable for the  following components:   Benzodiazepines POSITIVE (*)    Amphetamines POSITIVE (*)    All other components within normal limits  CBC WITH DIFFERENTIAL/PLATELET - Abnormal; Notable for the following components:   Hemoglobin 11.7 (*)    MCV 79.9 (*)    MCH 24.5 (*)    All other components within normal limits  ETHANOL  I-STAT BETA HCG BLOOD, ED ([facility], [facility], [facility] ONLY)  CBG MONITORING, ED    EKG EKG Interpretation  Date/Time:  Monday December 06 2021 06:51:18 EST Ventricular Rate:  64 PR Interval:  114 QRS Duration: 96 QT Interval:  412 QTC Calculation: 426 R Axis:   73 Text Interpretation: Sinus rhythm Borderline short PR interval Early repolarization When compared with ECG of 03/22/2020, No significant change was found Confirmed by Dione Booze ([id number]) on 12/06/2021 6:53:43 AM  Radiology CT Head Wo Contrast  Result Date: 12/06/2021 CLINICAL DATA:  Seizure. EXAM: CT HEAD WITHOUT  CONTRAST TECHNIQUE: Contiguous axial images were obtained from the base of the skull through the vertex without intravenous contrast. RADIATION DOSE REDUCTION: This exam was performed according to the departmental dose-optimization program which includes automated exposure control, adjustment of the mA and/or kV according to patient size and/or use of iterative reconstruction technique. COMPARISON:  04/19/2014 FINDINGS: Brain: No evidence of acute infarction, hemorrhage, hydrocephalus, extra-axial collection or mass lesion/mass effect. Vascular: No hyperdense vessel or unexpected calcification. Skull: Normal. Negative for fracture or focal lesion. Sinuses/Orbits: No acute finding. Other: None. IMPRESSION: No acute intracranial pathology. Electronically Signed   By: Signa Kell M.D.   On: 12/06/2021 07:47    Procedures .Critical Care  Performed by: Melene Plan, DO Authorized by: Melene Plan, DO   Critical care provider statement:    Critical care time (minutes):  35   Critical care time was exclusive of:  Separately billable procedures and treating other patients   Critical care was time spent personally by me on the following activities:  Development of treatment plan with patient or surrogate, discussions with consultants, evaluation of patient's response to treatment, examination of patient, ordering and review of laboratory studies, ordering and review of radiographic studies, ordering and performing treatments and interventions, pulse oximetry, re-evaluation of patient's condition and review of old charts   Care discussed with: admitting provider       Medications Ordered in ED Medications  ondansetron (ZOFRAN) injection 4 mg (4 mg Intravenous Given 12/06/21 0932)    ED Course/ Medical Decision Making/ A&P                           Medical Decision Making Amount and/or Complexity of Data Reviewed Labs: ordered.  Risk Prescription drug management. Decision regarding  hospitalization.   31 yo F with a  cc of concern for a possible seizure.  Given midazolam by EMS.  Sleepy on exam, will obs in the ED.  Labs.  CT.  Reassess.  CT head negative, no electrolyte abnormality.  No significant anemia.    Now able to awake the patient with painful stimuli.  Patient moving all four extremities without weakness.    Discussed with Dr. Amada Jupiter, neuro.  Recommends continued obs, if improves to baseline, ok to send home with outpatient follow up.  If continued delay to baseline would recommend medical admission.   Patient with continued confusion.  Not able to eat and drink.  Discussed with Dr. Amada Jupiter. Will come and see recommends medical admission.   No significant electrolyte  abnormality.  No acute anemia.   The patients results and plan were reviewed and discussed.   Any x-rays performed were independently reviewed by myself.   Differential diagnosis were considered with the presenting HPI.  Medications  ondansetron (ZOFRAN) injection 4 mg (4 mg Intravenous Given 12/06/21 0932)    Vitals:   12/06/21 0715 12/06/21 0815 12/06/21 0900 12/06/21 1015  BP: (!) 179/108 (!) 143/107 (!) 132/93 (!) 157/104  Pulse: (!) 53 68 63 (!) 59  Resp: (!) 22 17 (!) 21 18  Temp:      TempSrc:      SpO2: 100% 100% 100% 100%  Weight:      Height:        Final diagnoses:  Seizure-like activity (HCC)    Admission/ observation were discussed with the admitting physician, patient and/or family and they are comfortable with the plan.          Final Clinical Impression(s) / ED Diagnoses Final diagnoses:  Seizure-like activity [facility])    Rx / DC Orders ED Discharge Orders     None         Melene Plan, DO 12/06/21 1156

## 2021-12-06 NOTE — ED Triage Notes (Signed)
BIBA from jail. Staff states witnessed seizure activity with L eye deviation and gaging.   Upon EMS arrival patient was apneic with RR 2/minute. EMS was BVM for 20 mins, midazolam 2.5mg  IV given en route. Patient stating 100% RA upon arrival to ED. Patient has prior h/o opioid use. Been in jail since 12/02/21.   EMS VS:  20 G L AC  160/100 60 HR 100% BVM CBG 174

## 2021-12-06 NOTE — Consult Note (Signed)
NEUROLOGY CONSULTATION NOTE   Date of service: December 06, 2021 Patient Name: [name] [name] MRN:  [id number] DOB:  05-26-1990 Reason for consult: "Seizures" Requesting Provider: Jonah Blue, MD _ _ _   _ __   _ __ _ _  __ __   _ __   __ _  History of Present Illness  [name] [name] is a 31 y.o. female with PMH significant for cervical cyst, menorrhagia, and polysubstance use who presents to [facility] via EMS from jail for witnessed seizure. UDS positive for benzodiazepine and amphetamines. Patient endorses taking methamphetamine and fentanyl on 11/16 before being arrested. Endorses having seizures in the past related to her drug use. She reports experiencing unspecified pain throughout her body and weakness, feels it is more pronounced on her left. Given her current mentation, she is unable to provide specific details of her history.    ROS   ROS negative except as mentioned in the HPI.  Past History   Past Medical History:  Diagnosis Date   Cervical cyst    Menorrhagia    Polysubstance abuse (HCC)    Past Surgical History:  Procedure Laterality Date   DILATION AND CURETTAGE OF UTERUS     TAB    Family History  Problem Relation Age of Onset   Stroke Mother    Cancer Maternal Grandmother    Other Neg Hx    Social History   Socioeconomic History   Marital status: Single    Spouse name: Not on file   Number of children: Not on file   Years of education: Not on file   Highest education level: Not on file  Occupational History   Not on file  Tobacco Use   Smoking status: Every Day    Packs/day: 0.25    Years: 8.00    Total pack years: 2.00    Types: Cigarettes   Smokeless tobacco: Never  Substance and Sexual Activity   Alcohol use: Yes    Comment: occ   Drug use: Yes    Types: Marijuana    Comment: Heroin (twice daily/intranasal)   Sexual activity: Not on file  Other Topics Concern   Not on file  Social History Narrative   Not on file   Social Determinants of  Health   Financial Resource Strain: Not on file  Food Insecurity: Not on file  Transportation Needs: Not on file  Physical Activity: Not on file  Stress: Not on file  Social Connections: Not on file   Allergies  Allergen Reactions   Coconut Fatty Acids Anaphylaxis and Hives   Acetaminophen Nausea Only    Medications  (Not in a hospital admission)    Vitals   Vitals:   12/06/21 0715 12/06/21 0815 12/06/21 0900 12/06/21 1015  BP: (!) 179/108 (!) 143/107 (!) 132/93 (!) 157/104  Pulse: (!) 53 68 63 (!) 59  Resp: (!) 22 17 (!) 21 18  Temp:      TempSrc:      SpO2: 100% 100% 100% 100%  Weight:      Height:         Body mass index is 24.14 kg/m.  Physical Exam   Physical Exam  General: appears awake but lethargic HEENT-  Normocephalic, no lesions, without obvious abnormality.  Normal external eye and conjunctiva.   Lungs-no rhonchi or wheezing noted, no excessive working breathing.  Saturations within normal limits Extremities- Warm, dry and intact Musculoskeletal-no joint tenderness, deformity or swelling Skin-warm and dry  Neurological Examination Mental  Status: Awake, lethargic, not oriented to person, place or time.  Speech fluent without evidence of aphasia.  Able to follow simple commands.  Cranial Nerves: Unable to assess pupils or extraocular movements as patient eyes did not open fully and endorsed pain when flashing lights in her eyes.  Grimace grossly symmetrical, facial light touch sensation equal bilaterally. Motor: Weakness in all extremities, grossly equally, strength 1/5 throughout. Fingergrip strength 3/5 of BUE. Tone and bulk:normal tone throughout; no atrophy noted Sensory: Pinprick and light touch intact throughout, bilaterally Gait: deferred   Labs   CBC:  Recent Labs  Lab 12/06/21 0649  WBC 8.8  NEUTROABS 5.4  HGB 11.7*  HCT 38.1  MCV 79.9*  PLT 345    Basic Metabolic Panel:  Lab Results  Component Value Date   NA 137  12/06/2021   K 3.7 12/06/2021   CO2 24 12/06/2021   GLUCOSE 105 (H) 12/06/2021   BUN 13 12/06/2021   CREATININE 0.88 12/06/2021   CALCIUM 9.9 12/06/2021   GFRNONAA >60 12/06/2021   GFRAA >60 05/01/2019   Lipid Panel: No results found for: "LDLCALC" HgbA1c: No results found for: "HGBA1C" Urine Drug Screen:     Component Value Date/Time   LABOPIA NONE DETECTED 12/06/2021 0809   COCAINSCRNUR NONE DETECTED 12/06/2021 0809   LABBENZ POSITIVE (A) 12/06/2021 0809   AMPHETMU POSITIVE (A) 12/06/2021 0809   THCU NONE DETECTED 12/06/2021 0809   LABBARB NONE DETECTED 12/06/2021 0809    Alcohol Level     Component Value Date/Time   ETH <10 12/06/2021 0649    CT Head without contrast(Personally reviewed): No acute intracranial abnormality    Assessment   [name] [name] is a 31 y.o. female with PMH significant for cervical cyst, menorrhagia, and polysubstance use arriving to [facility] ED via EMS from jail for report witnessed seizure, now presenting with .  On exam she appears acutely encephalopathic, likely in a post-ictal states following her seizure. CT imaging unremarkable for any acute findings.   IMPRESSION Provoked seizure secondary to methamphetamine use, UDS also positive for benzodiazepines.   Recommendations  -Order EEG  - Order MRI brain  Dr. Lorri Frederick  I have seen the patient and reviewed the above note by Dr. Theodis Aguas.  She continues to be sleepy but she is able to be aroused easily, though not oriented.  I do wonder about alcohol withdrawal, though she denies recent drinking to me.  Benzodiazepine withdrawal is also certainly a possibility.  Methamphetamine use can also predispose to seizures.  Though this is likely a provoked seizure secondary to meth use, I would favor further evaluation especially with her persistent encephalopathy.  EEG and MRI of her brain would be helpful.  I would hold off on antiepileptics at the current time.  Neurology will  continue to follow  Ritta Slot, MD [facility] [phone]  If 7pm- 7am, please page neurology on call as listed in AMION.

## 2021-12-06 NOTE — ED Notes (Signed)
Pt taken to MRI  

## 2021-12-06 NOTE — Progress Notes (Signed)
EEG complete - results pending 

## 2021-12-06 NOTE — ED Notes (Signed)
Pt returns from CT.

## 2021-12-06 NOTE — ED Notes (Signed)
Pt transported to CT ?

## 2021-12-06 NOTE — Procedures (Signed)
Patient Name: [name] [name]  MRN: [id number]  Epilepsy Attending: Charlsie Quest  Referring Physician/Provider: Lorri Frederick, MD  Date: 12/06/2021 Duration: 23.34 mins  Patient history: 31yo F with seizure like activity. EEG to evaluate for seizure.  Level of alertness: Awake, asleep  AEDs during EEG study: None  Technical aspects: This EEG study was done with scalp electrodes positioned according to the 10-20 International system of electrode placement. Electrical activity was reviewed with band pass filter of 1-70Hz , sensitivity of 7 uV/mm, display speed of 19mm/sec with a 60Hz  notched filter applied as appropriate. EEG data were recorded continuously and digitally stored.  Video monitoring was available and reviewed as appropriate.  Description: The posterior dominant rhythm consists of 9 Hz activity of moderate voltage (25-35 uV) seen predominantly in posterior head regions, symmetric and reactive to eye opening and eye closing. Sleep was characterized by vertex waves, sleep spindles (12 to 14 Hz), maximal frontocentral region. Hyperventilation and photic stimulation were not performed.     IMPRESSION: This study is within normal limits. No seizures or epileptiform discharges were seen throughout the recording.  A normal interictal EEG does not exclude the diagnosis of epilepsy.  [name] 

## 2021-12-06 NOTE — ED Provider Notes (Signed)
MSE was initiated and I personally evaluated the patient and placed orders (if any) at  6:59 AM on December 06, 2021.  The patient appears stable so that the remainder of the MSE may be completed by another provider.  Patient presented from jail with report of a witnessed seizure.  EMS reported initial apnea and they assisted respirations with bag-valve-mask for 20 minutes.  She was given midazolam 2.5 mg.  On exam she is somnolent but does follow commands, no focal findings.  This will be treated as a first-time seizure.   Dione Booze, MD 12/06/21 0700
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
## 2021-12-07 DIAGNOSIS — F1910 Other psychoactive substance abuse, uncomplicated: Secondary | ICD-10-CM

## 2021-12-07 LAB — CBC
HCT: 34.9 % — ABNORMAL LOW (ref 36.0–46.0)
Hemoglobin: 11.1 g/dL — ABNORMAL LOW (ref 12.0–15.0)
MCH: 24.8 pg — ABNORMAL LOW (ref 26.0–34.0)
MCHC: 31.8 g/dL (ref 30.0–36.0)
MCV: 78.1 fL — ABNORMAL LOW (ref 80.0–100.0)
Platelets: 286 10*3/uL (ref 150–400)
RBC: 4.47 MIL/uL (ref 3.87–5.11)
RDW: 15.2 % (ref 11.5–15.5)
WBC: 7.6 10*3/uL (ref 4.0–10.5)
nRBC: 0 % (ref 0.0–0.2)

## 2021-12-07 LAB — BASIC METABOLIC PANEL
Anion gap: 9 (ref 5–15)
BUN: 11 mg/dL (ref 6–20)
CO2: 25 mmol/L (ref 22–32)
Calcium: 9.6 mg/dL (ref 8.9–10.3)
Chloride: 102 mmol/L (ref 98–111)
Creatinine, Ser: 0.71 mg/dL (ref 0.44–1.00)
GFR, Estimated: >60 mL/min (ref >60)
Glucose, Bld: 100 mg/dL — ABNORMAL HIGH (ref 70–99)
Potassium: 3.2 mmol/L — ABNORMAL LOW (ref 3.5–5.1)
Sodium: 136 mmol/L (ref 135–145)

## 2021-12-07 LAB — HIV ANTIBODY (ROUTINE TESTING W REFLEX): HIV Screen 4th Generation wRfx: NONREACTIVE

## 2021-12-07 MED ORDER — THIAMINE MONONITRATE 100 MG PO TABS
100.0000 mg | ORAL_TABLET | Freq: Every day | ORAL | Status: DC
  Administered 2021-12-07 – 2021-12-08 (×2): 100 mg via ORAL
  Filled 2021-12-07 (×2): qty 1

## 2021-12-07 MED ORDER — FOLIC ACID 1 MG PO TABS
1.0000 mg | ORAL_TABLET | Freq: Every day | ORAL | Status: DC
  Administered 2021-12-07 – 2021-12-08 (×2): 1 mg via ORAL
  Filled 2021-12-07 (×2): qty 1

## 2021-12-07 MED ORDER — ADULT MULTIVITAMIN W/MINERALS CH
1.0000 | ORAL_TABLET | Freq: Every day | ORAL | Status: DC
  Administered 2021-12-07 – 2021-12-08 (×2): 1 via ORAL
  Filled 2021-12-07 (×2): qty 1

## 2021-12-07 MED ORDER — SODIUM CHLORIDE 0.9 % IV SOLN
1.0000 g | INTRAVENOUS | Status: DC
  Administered 2021-12-07: 1 g via INTRAVENOUS
  Filled 2021-12-07: qty 10

## 2021-12-07 MED ORDER — LIP MEDEX EX OINT
1.0000 | TOPICAL_OINTMENT | CUTANEOUS | Status: DC | PRN
  Filled 2021-12-07: qty 7

## 2021-12-07 MED ORDER — BOOST / RESOURCE BREEZE PO LIQD CUSTOM
1.0000 | Freq: Three times a day (TID) | ORAL | Status: DC
  Administered 2021-12-07 (×2): 1 via ORAL

## 2021-12-07 MED ORDER — IBUPROFEN 200 MG PO TABS
600.0000 mg | ORAL_TABLET | ORAL | Status: DC | PRN
  Administered 2021-12-07 – 2021-12-08 (×2): 600 mg via ORAL
  Filled 2021-12-07 (×2): qty 3

## 2021-12-07 MED ORDER — MELATONIN 3 MG PO TABS
3.0000 mg | ORAL_TABLET | Freq: Every evening | ORAL | Status: DC | PRN
  Administered 2021-12-07 (×2): 3 mg via ORAL
  Filled 2021-12-07 (×2): qty 1

## 2021-12-07 MED ORDER — QUETIAPINE FUMARATE 50 MG PO TABS
50.0000 mg | ORAL_TABLET | Freq: Two times a day (BID) | ORAL | Status: DC
  Administered 2021-12-07 – 2021-12-08 (×3): 50 mg via ORAL
  Filled 2021-12-07 (×3): qty 1

## 2021-12-07 NOTE — Progress Notes (Signed)
At 2300 on 11/20, pt was complaining of terrible pain throughout her "whole body". This nurse advised her that Tylenol was the only medication available, as she had been given toradol near the end of day shift. Pt stated that she needed it and requested that it be administered. Once she took it, she vomited it back up. She then requested that she be given another dose because of the severity of her pain and not having kept the previous dose down. This nurse advised her that another dose was not possible.  Pt later stated that she is allergic to tylenol. Pharmacy and Dr. Natale Milch were contacted. Both stated that she is not truly allergic, but has an intolerance to it. Both Caleb (pharmacist) and Dr. Natale Milch stated that tylenol could be given to an intolerant patient if the benefit outweighed the potential for adverse reaction. Pt exhibited no discernable adverse reaction, as she was already experiencing nausea and vomiting before the administration occurred.

## 2021-12-07 NOTE — Progress Notes (Signed)
PROGRESS NOTE    [name] [name]  OAC:[id number] DOB: 1990/05/30 DOA: 12/06/2021 PCP: Patient, No Pcp Per  Brief Narrative:  [name] [name] is a 31 y.o. female with medical history significant of polysubstance abuse, mood disorder and questionably bipolar versus schizophrenia per report.  She is presenting with seizure-like activity from jail. She has admitted to recently using illicit substances about every other day on average including methamphetamines.  Hospitalist called for admission, neurology consulted at intake.  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  Will have patient follow up with PCP for routine testing as well as for repeat MRI in 6-12 months.   Polysubstance dependence Concurrent intractable nausea/vomiting - likely withdrawal  -Patient reports heavy methamphetamine use, at least every other day -Seizure as above likely provoked secondary to methamphetamine withdrawal -Intractable nausea vomiting and reported diarrhea likely secondary to withdrawal -Patient states she is being admitted to a rehab center after she is discharged from jail -Continue supportive care   Unclear psych history Suicidal ideation - Patient notes that she had been previously diagnosed with mood disorder questionably as schizophrenia or bipolar she cannot recall. - Soon after leaving patient's room about possibly initiating her on an antidepressant she indicated to staff she was suicidal, psychiatry has been consulted, appreciate insight and  recommendations  UTI, POA -Patient indicates burning with urination, urinalysis yet to be collected, -Initiate ceftriaxone x 3 days for coverage  Tobacco dependence - Encourage cessation.   - This was discussed with the patient and should be reviewed on an ongoing basis.   - Patch ordered at patient request.    DVT prophylaxis: Lovenox Code Status: Full Family Communication: None present  Status is: Observation  Dispo: The patient is from: [place]              Anticipated d/c is to: Same              Anticipated d/c date is: 24 to 48 hours pending clinical course              Patient currently not medically stable for discharge  Consultants:  Psychiatry, neurology  Procedures:  None  Antimicrobials:  Ceftriaxone x 3 days  Subjective: Intractable nausea vomiting noted overnight, transition to clears with IV fluids in the interim, likely secondary to withdrawals, continue supportive care.  Patient states she feels sore all over, fatigued weak but nausea and vomiting are better.  She denies headache fever chills constipation shortness of breath.  After completing interview patient subsequently noted to staff she was suicidal, confirmed at bedside.  Objective: Vitals:   12/06/21 1959 12/06/21 2348 12/07/21 0000 12/07/21 0330  BP: 123/84 96/82 98/84 (!) 137/93  Pulse: 64 67 68 (!) 57  Resp:   20 16  Temp: 100.2 F (37.9 C) 98 F (36.7 C) 98.1 F (36.7 C) 97.7 F (36.5 C)  TempSrc: Oral Oral Oral Oral  SpO2: 100% 100% 100% 100%  Weight:      Height:        Intake/Output Summary (Last 24 hours) at 12/07/2021 0733 Last data filed at 12/06/2021 0109 Gross  per 24 hour  Intake --  Output 150 ml  Net -150 ml   Filed Weights   12/06/21 0653  Weight: 65.8 kg    Examination:  General:  Pleasantly resting in bed, No acute distress. HEENT:  Normocephalic atraumatic.  Sclerae nonicteric, noninjected.  Extraocular movements intact bilaterally. Neck:  Without mass or  deformity.  Trachea is midline. Lungs:  Clear to auscultate bilaterally without rhonchi, wheeze, or rales. Heart:  Regular rate and rhythm.  Without murmurs, rubs, or gallops. Abdomen:  Soft, nontender, nondistended.  Without guarding or rebound. Extremities: Without cyanosis, clubbing, edema, or obvious deformity. Vascular:  Dorsalis pedis and posterior tibial pulses palpable bilaterally. Skin:  Warm and dry, no erythema, no ulcerations.    Data Reviewed: I have personally reviewed following labs and imaging studies  CBC: Recent Labs  Lab 12/06/21 0649 12/07/21 0438  WBC 8.8 7.6  NEUTROABS 5.4  --   HGB 11.7* 11.1*  HCT 38.1 34.9*  MCV 79.9* 78.1*  PLT 345 286   Basic Metabolic Panel: Recent Labs  Lab 12/06/21 0649 12/07/21 0438  NA 137 136  K 3.7 3.2*  CL 103 102  CO2 24 25  GLUCOSE 105* 100*  BUN 13 11  CREATININE 0.88 0.71  CALCIUM 9.9 9.6   GFR: Estimated Creatinine Clearance: 90 mL/min (by C-G formula based on SCr of 0.71 mg/dL). Liver Function Tests: Recent Labs  Lab 12/06/21 0649  AST 28  ALT 21  ALKPHOS 45  BILITOT 0.9  PROT 7.5  ALBUMIN 4.0   No results for input(s): "LIPASE", "AMYLASE" in the last 168 hours. No results for input(s): "AMMONIA" in the last 168 hours. Coagulation Profile: No results for input(s): "INR", "PROTIME" in the last 168 hours. Cardiac Enzymes: No results for input(s): "CKTOTAL", "CKMB", "CKMBINDEX", "TROPONINI" in the last 168 hours. BNP (last 3 results) No results for input(s): "PROBNP" in the last 8760 hours. HbA1C: No results for input(s): "HGBA1C" in the last 72 hours. CBG: Recent Labs  Lab 12/06/21 0733  GLUCAP 88   Lipid Profile: No results for input(s): "CHOL", "HDL", "LDLCALC", "TRIG", "CHOLHDL", "LDLDIRECT" in the last 72 hours. Thyroid Function Tests: No results for input(s): "TSH", "T4TOTAL", "FREET4", "T3FREE", "THYROIDAB" in the last 72 hours. Anemia Panel: No results for input(s): "VITAMINB12",  "FOLATE", "FERRITIN", "TIBC", "IRON", "RETICCTPCT" in the last 72 hours. Sepsis Labs: No results for input(s): "PROCALCITON", "LATICACIDVEN" in the last 168 hours.  No results found for this or any previous visit (from the past 240 hour(s)).   Radiology Studies: EEG adult  Result Date: 12/06/2021 Yadav, Priyanka O, MD     12/06/2021  2:55 PM Patient Name: [name] [name] MRN: [id number] Epilepsy Attending: Priyanka O Yadav Referring Physician/Provider: Carrion-Carrero, Margely, MD Date: 12/06/2021 Duration: 23.34 mins Patient history: 31yo F with seizure like activity. EEG to evaluate for seizure. Level of alertness: Awake, asleep AEDs during EEG study: None Technical aspects: This EEG study was done with scalp electrodes positioned according to the 10-20 International system of electrode placement. Electrical activity was reviewed with band pass filter of 1-70Hz, sensitivity of 7 uV/mm, display speed of 30mm/sec with a 60Hz notched filter applied as appropriate. EEG data were recorded continuously and digitally stored.  Video monitoring was available and reviewed as appropriate. Description: The posterior dominant rhythm consists of 9 Hz activity of moderate voltage (25-35 uV) seen predominantly in posterior head regions, symmetric and reactive to eye opening and eye closing. Sleep was characterized by vertex waves, sleep spindles (12 to   14 Hz), maximal frontocentral region. Hyperventilation and photic stimulation were not performed.   IMPRESSION: This study is within normal limits. No seizures or epileptiform discharges were seen throughout the recording. A normal interictal EEG does not exclude the diagnosis of epilepsy. Lora Havens   MR BRAIN WO CONTRAST  Result Date: 12/06/2021 CLINICAL DATA:  Altered mental status.  Seizure-like activity. EXAM: MRI HEAD WITHOUT CONTRAST TECHNIQUE: Multiplanar, multiecho pulse sequences of the brain and surrounding structures were obtained without intravenous  contrast. COMPARISON:  Head CT 12/06/2021 FINDINGS: The study is intermittently moderately motion degraded. Brain: There is no evidence of an acute infarct, intracranial hemorrhage, midline shift, or extra-axial fluid collection. There is a 15 x 8 x 7 mm uniformly intrinsically T1 hyperintense lesion in the posterior aspect of the pituitary gland without significant mass effect. The brain is otherwise unremarkable in signal. The ventricles and sulci are normal. A small cavum septum pellucidum is incidentally noted. Vascular: Major intracranial vascular flow voids are preserved. Skull and upper cervical spine: Unremarkable bone marrow signal. Sinuses/Orbits: Unremarkable orbits. Paranasal sinuses and mastoid air cells are clear. Other: None. IMPRESSION: 1. No acute intracranial abnormality. 2. 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 Endocrine function tests are recommended. Follow-up pituitary protocol MRI with and without contrast should be performed in 6-12 months. This follows ACR consensus guidelines: Management of Incidental Pituitary Findings on CT, MRI and F18-FDG PET: A White Paper of the ACR Incidental Findings Committee. J Am Coll Radiol 2018; 15: 810-17. Electronically Signed   By: Logan Bores M.D.   On: 12/06/2021 14:02   CT Head Wo Contrast  Result Date: 12/06/2021 CLINICAL DATA:  Seizure. EXAM: CT HEAD WITHOUT CONTRAST TECHNIQUE: Contiguous axial images were obtained from the base of the skull through the vertex without intravenous contrast. RADIATION DOSE REDUCTION: This exam was performed according to the departmental dose-optimization program which includes automated exposure control, adjustment of the mA and/or kV according to patient size and/or use of iterative reconstruction technique. COMPARISON:  04/19/2014 FINDINGS: Brain: No evidence of acute infarction, hemorrhage, hydrocephalus, extra-axial collection or mass lesion/mass effect.  Vascular: No hyperdense vessel or unexpected calcification. Skull: Normal. Negative for fracture or focal lesion. Sinuses/Orbits: No acute finding. Other: None. IMPRESSION: No acute intracranial pathology. Electronically Signed   By: Kerby Moors M.D.   On: 12/06/2021 07:47    Scheduled Meds:  chlorhexidine gluconate (MEDLINE KIT)  15 mL Mouth Rinse BID   enoxaparin (LOVENOX) injection  40 mg Subcutaneous Q24H   nicotine  14 mg Transdermal Daily   mouth rinse  15 mL Mouth Rinse 10 times per day   sodium chloride flush  3 mL Intravenous Q12H   Continuous Infusions:  lactated ringers 75 mL/hr at 12/07/21 0419    LOS: 0 days   Time spent: 54mn  Quanita Barona C Jasmon Graffam, DO [facility]  If 7PM-7AM, please contact night-coverage [email or web address]  12/07/2021, 7:33 AM

## 2021-12-07 NOTE — TOC CAGE-AID Note (Signed)
Transition of Care [facility]) - CAGE-AID Screening   Patient Details  Name: [name] MRN: [id number] Date of Birth: 1990-02-16  Transition of Care [id number]) CM/SW Contact:    Kermit Balo, RN Phone Number: 12/07/2021, 12:19 PM   Clinical Narrative: CM provided resources for inpatient/ outpatient drug counseling.   CAGE-AID Screening:

## 2021-12-07 NOTE — Progress Notes (Signed)
Subjective: Patient evaluated at bedside this morning, remains lethargic but improved from yesterday.    Mentation is improved from yesterday, is oriented to self and place.  She states it is December 2022. Endorsing diffuse abdominal, nausea and vaginal pain.  Symptoms were discussed with primary team, likely related to her polysubstance use.   She also continues to endorse bilateral EOM pain, her orbits are noticeably swollen.  Exquisitely tender to touch, eye exam is limited due to this. Objective: Current vital signs: BP 130/80 (BP Location: Right Arm)   Pulse (!) 54   Temp 98.8 F (37.1 C) (Oral)   Resp 16   Ht _0  (1.651 m)   Wt 65.8 kg   SpO2 99%   BMI 24.14 kg/m  Vital signs in last 24 hours: Temp:  [97.7 F (36.5 C)-100.2 F (37.9 C)] 98.8 F (37.1 C) (11/21 0838) Pulse Rate:  [54-96] 54 (11/21 0838) Resp:  [15-23] 16 (11/21 0838) BP: (96-157)/(80-104) 130/80 (11/21 0838) SpO2:  [99 %-100 %] 99 % (11/21 0838)  Intake/Output from previous day: 11/20 0701 - 11/21 0700 In: -  Out: 150 [Urine:150] Intake/Output this shift: No intake/output data recorded. Nutritional status:  Diet Order             Diet regular Room service appropriate? Yes; Fluid consistency: Thin  Diet effective now                   Neurologic Exam: Physical Exam  General: More awake, alert today.  Still appears lethargic. HEENT-  Normocephalic, no lesions, without obvious abnormality.  Normal external eye and conjunctiva.   Lungs-no rhonchi or wheezing noted, no excessive working breathing.  Saturations within normal limits Extremities- Warm, dry and intact Musculoskeletal-no joint tenderness, deformity or swelling Skin-warm and dry   Neurological Examination Mental Status: Awake, more alert, oriented to person and place.  Not oriented to time, states it is December 2022.  Able to follow verbal commands. Language: Comprehension, fluency, repetition all intact. Cranial  Nerves: Unable to assess pupils or extraocular movements as patient eyes did not open fully and endorsed pain when flashing lights in her eyes.  Grimace grossly symmetrical, facial light touch sensation equal bilaterally.  Palate elevation symmetric.  There is deviation to the left, no cyst relations. Motor: Weakness in all extremities, improved from yesterday, grossly equally, strength 3/5 throughout. Fingergrip strength 3/5 of BUE. Tone and bulk:normal tone throughout; no atrophy noted Sensory: Pinprick and light touch intact throughout, bilaterally Gait: deferred   Lab Results: Results for orders placed or performed during the hospital encounter of 12/06/21 (from the past 48 hour(s))  Comprehensive metabolic panel     Status: Abnormal   Collection Time: 12/06/21  6:49 AM  Result Value Ref Range   Sodium 137 135 - 145 mmol/L   Potassium 3.7 3.5 - 5.1 mmol/L    Comment: HEMOLYSIS AT THIS LEVEL MAY AFFECT RESULT   Chloride 103 98 - 111 mmol/L   CO2 24 22 - 32 mmol/L   Glucose, Bld 105 (H) 70 - 99 mg/dL    Comment: Glucose reference range applies only to samples taken after fasting for at least 8 hours.   BUN 13 6 - 20 mg/dL   Creatinine, Ser 0.88 0.44 - 1.00 mg/dL   Calcium 9.9 8.9 - 10.3 mg/dL   Total Protein 7.5 6.5 - 8.1 g/dL   Albumin 4.0 3.5 - 5.0 g/dL   AST 28 15 - 41 U/L    Comment: HEMOLYSIS  AT THIS LEVEL MAY AFFECT RESULT   ALT 21 0 - 44 U/L    Comment: HEMOLYSIS AT THIS LEVEL MAY AFFECT RESULT   Alkaline Phosphatase 45 38 - 126 U/L   Total Bilirubin 0.9 0.3 - 1.2 mg/dL    Comment: HEMOLYSIS AT THIS LEVEL MAY AFFECT RESULT   GFR, Estimated >60 >60 mL/min    Comment: (NOTE) Calculated using the CKD-EPI Creatinine Equation (2021)    Anion gap 10 5 - 15    Comment: Performed at [facility], [place] [address], [place], [place] [address]  Ethanol     Status: None   Collection Time: 12/06/21  6:49 AM  Result Value Ref Range   Alcohol, Ethyl (B) <10 <10 mg/dL     Comment: (NOTE) Lowest detectable limit for serum alcohol is 10 mg/dL.  For medical purposes only. Performed at [facility], [place] [address], [place], [place] [address]   CBC with Differential     Status: Abnormal   Collection Time: 12/06/21  6:49 AM  Result Value Ref Range   WBC 8.8 4.0 - 10.5 K/uL   RBC 4.77 3.87 - 5.11 MIL/uL   Hemoglobin 11.7 (L) 12.0 - 15.0 g/dL   HCT 38.1 36.0 - 46.0 %   MCV 79.9 (L) 80.0 - 100.0 fL   MCH 24.5 (L) 26.0 - 34.0 pg   MCHC 30.7 30.0 - 36.0 g/dL   RDW 15.5 11.5 - 15.5 %   Platelets 345 150 - 400 K/uL   nRBC 0.0 0.0 - 0.2 %   Neutrophils Relative % 61 %   Neutro Abs 5.4 1.7 - 7.7 K/uL   Lymphocytes Relative 30 %   Lymphs Abs 2.6 0.7 - 4.0 K/uL   Monocytes Relative 8 %   Monocytes Absolute 0.7 0.1 - 1.0 K/uL   Eosinophils Relative 0 %   Eosinophils Absolute 0.0 0.0 - 0.5 K/uL   Basophils Relative 1 %   Basophils Absolute 0.0 0.0 - 0.1 K/uL   Immature Granulocytes 0 %   Abs Immature Granulocytes 0.02 0.00 - 0.07 K/uL    Comment: Performed at [facility], [place] [address], [place], [place] [address]  I-Stat beta hCG blood, ED     Status: None   Collection Time: 12/06/21  7:23 AM  Result Value Ref Range   I-stat hCG, quantitative <5.0 <5 mIU/mL   Comment 3            Comment:   GEST. AGE      CONC.  (mIU/mL)   <=1 WEEK        5 - 50     2 WEEKS       50 - 500     3 WEEKS       100 - 10,000     4 WEEKS     1,000 - 30,000        FEMALE AND NON-PREGNANT FEMALE:     LESS THAN 5 mIU/mL   CBG monitoring, ED     Status: None   Collection Time: 12/06/21  7:33 AM  Result Value Ref Range   Glucose-Capillary 88 70 - 99 mg/dL    Comment: Glucose reference range applies only to samples taken after fasting for at least 8 hours.  Urine rapid drug screen (hosp performed)     Status: Abnormal   Collection Time: 12/06/21  8:09 AM  Result Value Ref Range   Opiates NONE DETECTED NONE DETECTED  Cocaine NONE DETECTED NONE DETECTED    Benzodiazepines POSITIVE (A) NONE DETECTED   Amphetamines POSITIVE (A) NONE DETECTED   Tetrahydrocannabinol NONE DETECTED NONE DETECTED   Barbiturates NONE DETECTED NONE DETECTED    Comment: (NOTE) DRUG SCREEN FOR MEDICAL PURPOSES ONLY.  IF CONFIRMATION IS NEEDED FOR ANY PURPOSE, NOTIFY LAB WITHIN 5 DAYS.  LOWEST DETECTABLE LIMITS FOR URINE DRUG SCREEN Drug Class                     Cutoff (ng/mL) Amphetamine and metabolites    1000 Barbiturate and metabolites    200 Benzodiazepine                 200 Opiates and metabolites        300 Cocaine and metabolites        300 THC                            50 Performed at [facility], [place] [address] [place], [place] [address]   Basic metabolic panel     Status: Abnormal   Collection Time: 12/07/21  4:38 AM  Result Value Ref Range   Sodium 136 135 - 145 mmol/L   Potassium 3.2 (L) 3.5 - 5.1 mmol/L   Chloride 102 98 - 111 mmol/L   CO2 25 22 - 32 mmol/L   Glucose, Bld 100 (H) 70 - 99 mg/dL    Comment: Glucose reference range applies only to samples taken after fasting for at least 8 hours.   BUN 11 6 - 20 mg/dL   Creatinine, Ser 0.71 0.44 - 1.00 mg/dL   Calcium 9.6 8.9 - 10.3 mg/dL   GFR, Estimated >60 >60 mL/min    Comment: (NOTE) Calculated using the CKD-EPI Creatinine Equation (2021)    Anion gap 9 5 - 15    Comment: Performed at [place] [address], [place], [place] [address]  CBC     Status: Abnormal   Collection Time: 12/07/21  4:38 AM  Result Value Ref Range   WBC 7.6 4.0 - 10.5 K/uL   RBC 4.47 3.87 - 5.11 MIL/uL   Hemoglobin 11.1 (L) 12.0 - 15.0 g/dL   HCT 34.9 (L) 36.0 - 46.0 %   MCV 78.1 (L) 80.0 - 100.0 fL   MCH 24.8 (L) 26.0 - 34.0 pg   MCHC 31.8 30.0 - 36.0 g/dL   RDW 15.2 11.5 - 15.5 %   Platelets 286 150 - 400 K/uL   nRBC 0.0 0.0 - 0.2 %    Comment: Performed at [facility], [place] [address], [place],  [address]    No results found for this or any previous visit (from the  past 240 hour(s)).  Lipid Panel No results for input(s): "CHOL", "TRIG", "HDL", "CHOLHDL", "VLDL", "LDLCALC" in the last 72 hours.  Studies/Results: EEG adult  Result Date: 12/06/2021 Lora Havens, MD     12/06/2021  2:55 PM Patient Name: Laisha Rau MRN: [id number] Epilepsy Attending: Lora Havens Referring Physician/Provider: Christene Slates, MD Date: 12/06/2021 Duration: 23.34 mins Patient history: 31yo F with seizure like activity. EEG to evaluate for seizure. Level of alertness: Awake, asleep AEDs during EEG study: None Technical aspects: This EEG study was done with scalp electrodes positioned according to the 10-20 International system of electrode placement. Electrical activity was reviewed with band pass filter of 1-_0 , sensitivity of 7 uV/mm, display speed  of 23m/sec with a _0  notched filter applied as appropriate. EEG data were recorded continuously and digitally stored.  Video monitoring was available and reviewed as appropriate. Description: The posterior dominant rhythm consists of 9 Hz activity of moderate voltage (25-35 uV) seen predominantly in posterior head regions, symmetric and reactive to eye opening and eye closing. Sleep was characterized by vertex waves, sleep spindles (12 to 14 Hz), maximal frontocentral region. Hyperventilation and photic stimulation were not performed.   IMPRESSION: This study is within normal limits. No seizures or epileptiform discharges were seen throughout the recording. A normal interictal EEG does not exclude the diagnosis of epilepsy. PLora Havens  MR BRAIN WO CONTRAST  Result Date: 12/06/2021 CLINICAL DATA:  Altered mental status.  Seizure-like activity. EXAM: MRI HEAD WITHOUT CONTRAST TECHNIQUE: Multiplanar, multiecho pulse sequences of the brain and surrounding structures were obtained without intravenous contrast. COMPARISON:  Head CT 12/06/2021 FINDINGS: The study is intermittently moderately motion degraded. Brain:  There is no evidence of an acute infarct, intracranial hemorrhage, midline shift, or extra-axial fluid collection. There is a 15 x 8 x 7 mm uniformly intrinsically T1 hyperintense lesion in the posterior aspect of the pituitary gland without significant mass effect. The brain is otherwise unremarkable in signal. The ventricles and sulci are normal. A small cavum septum pellucidum is incidentally noted. Vascular: Major intracranial vascular flow voids are preserved. Skull and upper cervical spine: Unremarkable bone marrow signal. Sinuses/Orbits: Unremarkable orbits. Paranasal sinuses and mastoid air cells are clear. Other: None. IMPRESSION: 1. No acute intracranial abnormality. 2. 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 Endocrine function tests are recommended. Follow-up pituitary protocol MRI with and without contrast should be performed in 6-12 months. This follows ACR consensus guidelines: Management of Incidental Pituitary Findings on CT, MRI and F18-FDG PET: A White Paper of the ACR Incidental Findings Committee. J Am Coll Radiol 2018; 15:: 975-88 Electronically Signed   By: ALogan BoresM.D.   On: 12/06/2021 14:02   CT Head Wo Contrast  Result Date: 12/06/2021 CLINICAL DATA:  Seizure. EXAM: CT HEAD WITHOUT CONTRAST TECHNIQUE: Contiguous axial images were obtained from the base of the skull through the vertex without intravenous contrast. RADIATION DOSE REDUCTION: This exam was performed according to the departmental dose-optimization program which includes automated exposure control, adjustment of the mA and/or kV according to patient size and/or use of iterative reconstruction technique. COMPARISON:  04/19/2014 FINDINGS: Brain: No evidence of acute infarction, hemorrhage, hydrocephalus, extra-axial collection or mass lesion/mass effect. Vascular: No hyperdense vessel or unexpected calcification. Skull: Normal. Negative for fracture or focal lesion.  Sinuses/Orbits: No acute finding. Other: None. IMPRESSION: No acute intracranial pathology. Electronically Signed   By: TKerby MoorsM.D.   On: 12/06/2021 07:47    Medications: Prior to Admission:  Medications Prior to Admission  Medication Sig Dispense Refill Last Dose   acetaminophen (TYLENOL) 325 MG tablet Take 650 mg by mouth 3 (three) times daily as needed for headache, mild pain or fever. 7 day course. Start date of 11.17.23, End Date of 11.24.23   unknown   loperamide (IMODIUM) 2 MG capsule Take 2 mg by mouth 3 (three) times daily as needed for diarrhea or loose stools. 7 day course. Start date of 11.17.23, End Date of 11.24.23   unknown   meclizine (ANTIVERT) 25 MG tablet Take 1 tablet (25 mg total) by mouth 3 (three) times daily as needed for dizziness. (Patient taking differently: Take 25 mg  by mouth 3 (three) times daily as needed for dizziness. 7 day course. Start date of 11.17.23, End Date of 11.24.23) 30 tablet 0 unknown   Scheduled:  chlorhexidine gluconate (MEDLINE KIT)  15 mL Mouth Rinse BID   enoxaparin (LOVENOX) injection  40 mg Subcutaneous Q24H   nicotine  14 mg Transdermal Daily   mouth rinse  15 mL Mouth Rinse 10 times per day   sodium chloride flush  3 mL Intravenous Q12H    Assessment/Plan: Keron Koffman is a 31 y.o. female with PMH significant for cervical cyst, menorrhagia, and polysubstance use arriving to [facility] ED via EMS from jail for report witnessed seizure, now presenting with .  On exam she appears acutely encephalopathic, likely in a post-ictal states following her seizure. CT imaging unremarkable for any acute findings. Mri findings negative for any acute intracranial abnormality.  There is findings suggestive of Rathke's cleft cyst, with recommendations to repeat MRI in 6-12 months.   IMPRESSION Provoked seizure secondary to methamphetamine use, UDS also positive for benzodiazepines.  -No further recommendations, appears improved from yesterday  Christene Slates, MD PGY-1  12/07/2021  10:08 AM  I have seen the patient and reviewed the above note.  She continues to complain of right-sided numbness (told the resident left side yesterday).  She has significant weakness on my initial exam, though with distraction, she is able to demonstrate full strength.  She also splits midline on the forehead to vibration.  She has a markedly inconsistent exam, consistent with nonorganic etiology.  Of note, she states that she could hear her during her episode of seizure yesterday.  Given other findings concerning for nonorganic etiology, there is a possibility that her seizure was also nonorganic.  If it was an epileptic seizure, then I would consider a provoked secondary to methamphetamine use.  No further workup at this time, please call with further questions or concerns.  Roland Rack, MD [facility] [phone]  If 7pm- 7am, please page neurology on call as listed in [place].
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
## 2021-12-07 NOTE — Progress Notes (Signed)
Initial Nutrition Assessment  DOCUMENTATION CODES:  Not applicable  INTERVENTION:  Advance diet as tolerated to regular Boost Breeze po TID, each supplement provides 250 kcal and 9 grams of protein MVI with minerals, 100mg  of thiamine daily, and 1mg  folic acid daily for hx drug abuse.  NUTRITION DIAGNOSIS:  Inadequate oral intake related to vomiting, nausea as evidenced by per patient/family report.  GOAL:  Patient will meet greater than or equal to 90% of their needs  MONITOR:  PO intake, Supplement acceptance, Diet advancement, Labs, I & O's  REASON FOR ASSESSMENT:  Malnutrition Screening Tool    ASSESSMENT:  Pt with hx of polysubstance abuse and seizure disorder presented to ED with seizure-like activity.  Pt sleeping at the time of assessment. RN and at bedside report that pt was vomiting last night and was made NPO and just now advanced to clears. Unable to complete a full physical assessment, examined left side of body and no significant deficits seen.   Will complete a full hx and exam when pt is more alert. RN reports recently receiving Seroquel.    Nutritionally Relevant Medications: Continuous Infusions:  lactated ringers 75 mL/hr at 12/07/21 0855   PRN Meds: ondansetron, promethazine  Labs Reviewed: K 3.2  NUTRITION - FOCUSED PHYSICAL EXAM: Defer to when pt is able to participate, no deficits seen to the left side of body  Diet Order:   Diet Order             Diet clear liquid Room service appropriate? Yes; Fluid consistency: Thin  Diet effective now                   EDUCATION NEEDS:  Not appropriate for education at this time  Skin:  Skin Assessment: Reviewed RN Assessment  Last BM:  unsure  Height:  Ht Readings from Last 1 Encounters:  12/06/21 5\' 5"  (1.651 m)   Weight:  Wt Readings from Last 1 Encounters:  12/06/21 65.8 kg   Ideal Body Weight:  56.8 kg  BMI:  Body mass index is 24.14 kg/m.  Estimated Nutritional  Needs:  Kcal:  1600-1800 kcal/d Protein:  80-95 g/d Fluid:  >/=1.6 L/d    12/08/21, RD, LDN Clinical Dietitian RD pager # available in [facility]  After hours/weekend pager # available in [facility]

## 2021-12-07 NOTE — TOC Initial Note (Signed)
Transition of Care [facility]) - Initial/Assessment Note    Patient Details  Name: [name] [name] MRN: [id number] Date of Birth: 12/24/1990  [facility] CM/SW Contact:    Kermit Balo, RN Phone Number: 12/07/2021, 12:20 PM  Clinical Narrative:                 Pt admitted from jail. CM spoke to the guard at the bedside as the patient was sleeping. Plan is for patient to return to jail at d/c. She will have needed transportation back to the jail house.  TOC following.    Barriers to Discharge: Continued Medical Work up   Patient Goals and CMS Choice        Expected Discharge Plan and Services     Discharge Planning Services: CM Consult                                          Prior Living Arrangements/Services   Lives with:: Other (Comment) (jail)                   Activities of Daily Living Home Assistive Devices/Equipment: None ADL Screening (condition at time of admission) Patient's cognitive ability adequate to safely complete daily activities?: Yes Is the patient deaf or have difficulty hearing?: No Does the patient have difficulty seeing, even when wearing glasses/contacts?: No Does the patient have difficulty concentrating, remembering, or making decisions?: Yes Patient able to express need for assistance with ADLs?: Yes Does the patient have difficulty dressing or bathing?: No Independently performs ADLs?: Yes (appropriate for developmental age) Does the patient have difficulty walking or climbing stairs?: No Weakness of Legs: Both Weakness of Arms/Hands: Both  Permission Sought/Granted                  Emotional Assessment              Admission diagnosis:  Seizure-like activity (HCC) [R56.9] Patient Active Problem List   Diagnosis Date Noted   Seizure-like activity (HCC) 12/06/2021   Tobacco abuse 05/01/2019   Leukocytosis 05/01/2019   Palpitations 05/01/2019   Acute on chronic blood loss anemia 04/30/2019    GBS (group B Streptococcus carrier), +RV culture, currently pregnant 12/21/2011   Polysubstance abuse (HCC) 12/21/2011   Supervision of normal pregnancy 12/12/2011   PCP:  Patient, No Pcp Per Pharmacy:   [facility] [address], [place] - [address] [place] [address] Phone: [phone] Fax: [phone]     Social Determinants of Health (SDOH) Interventions    Readmission Risk Interventions     No data to display

## 2021-12-08 MED ORDER — CEPHALEXIN 500 MG PO CAPS: 500.0000 mg | ORAL_CAPSULE | Freq: Three times a day (TID) | ORAL | 0 refills | Status: AC

## 2021-12-08 MED ORDER — POTASSIUM CHLORIDE CRYS ER 20 MEQ PO TBCR
40.0000 meq | EXTENDED_RELEASE_TABLET | Freq: Once | ORAL | Status: AC
  Administered 2021-12-08: 40 meq via ORAL
  Filled 2021-12-08: qty 2

## 2021-12-08 MED ORDER — VITAMIN B-1 100 MG PO TABS: 100.0000 mg | ORAL_TABLET | Freq: Every day | ORAL | 0 refills | Status: AC

## 2021-12-08 MED ORDER — QUETIAPINE FUMARATE 50 MG PO TABS: 50.0000 mg | ORAL_TABLET | Freq: Two times a day (BID) | ORAL | 0 refills | Status: AC

## 2021-12-08 MED ORDER — CEPHALEXIN 500 MG PO CAPS
500.0000 mg | ORAL_CAPSULE | Freq: Three times a day (TID) | ORAL | Status: DC
  Administered 2021-12-08: 500 mg via ORAL
  Filled 2021-12-08 (×2): qty 1

## 2021-12-08 MED ORDER — FOLIC ACID 1 MG PO TABS: 1.0000 mg | ORAL_TABLET | Freq: Every day | ORAL | 0 refills | Status: AC

## 2021-12-08 NOTE — Plan of Care (Signed)
  Problem: Medication: Goal: Risk for medication side effects will decrease Outcome: Progressing   Problem: Clinical Measurements: Goal: Complications related to the disease process, condition or treatment will be avoided or minimized Outcome: Progressing   Problem: Safety: Goal: Verbalization of understanding the information provided will improve Outcome: Progressing
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
## 2021-12-08 NOTE — Discharge Summary (Signed)
[facility]  Physician Discharge Summary   Patient ID: [name] [name] MRN: [id number] DOB/AGE: 1990-10-14 31 y.o.  Admit date: 12/06/2021 Discharge date:   12/08/2021   PCP: Patient, No Pcp Per  DISCHARGE DIAGNOSES:    Seizure-like activity ([place])   Polysubstance abuse ([place])   Tobacco abuse   RECOMMENDATIONS FOR OUTPATIENT FOLLOW UP: Patient will need endocrine work up for pituitary cyst noted incidentally. She will need to undergo repeat MRI Brain in 6-12 months    Home Health:None  Equipment/Devices:None   CODE STATUS:Full Code   DISCHARGE CONDITION: fair  Diet recommendation: Regular  INITIAL HISTORY: 31 y.o. female with medical history significant of polysubstance abuse, mood disorder and questionably bipolar versus schizophrenia per report.  She is presenting with seizure-like activity from jail. She has admitted to recently using illicit substances about every other day on average including methamphetamines.  Hospitalist called for admission, neurology consulted at intake.   Consultations: Neurology Psychiatry    HOSPITAL COURSE:   Seizure-like activity, questionably provoked secondary to illicit substance use -Patient reports h/o seizures associated with meth use but has never seen a doctor about it -Pateint seen by neurology. EEG was normal. Concern for possible non-organic etiology. No role for AED's per neurology.   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  Will have patient follow up with PCP for routine testing as well as for repeat MRI in 6-12 months.    Polysubstance dependence Concurrent intractable nausea/vomiting - likely withdrawal  -Patient reports heavy methamphetamine use, at least every other day -Intractable nausea vomiting and reported diarrhea likely secondary to withdrawal -Patient states she is being admitted to a rehab center after she is  discharged from jail   Unclear psych history Suicidal ideation - Patient notes that she had been previously diagnosed with mood disorder questionably as schizophrenia or bipolar she cannot recall. Patient mentioned that she was suicidal. Seen by psychiatry. Satrted on Seroquel. To be re-evaluated today.    UTI, POA Keflex for 3 days.   Tobacco dependence - Encourage cessation.    Okay for discharge back to jail once cleared by psychiatry.  PERTINENT LABS:  The results of significant diagnostics from this hospitalization (including imaging, microbiology, ancillary and laboratory) are listed below for reference.     Labs:   Basic Metabolic Panel: Recent Labs  Lab 12/06/21 0649 12/07/21 0438  NA 137 136  K 3.7 3.2*  CL 103 102  CO2 24 25  GLUCOSE 105* 100*  BUN 13 11  CREATININE 0.88 0.71  CALCIUM 9.9 9.6   Liver Function Tests: Recent Labs  Lab 12/06/21 0649  AST 28  ALT 21  ALKPHOS 45  BILITOT 0.9  PROT 7.5  ALBUMIN 4.0    CBC: Recent Labs  Lab 12/06/21 0649 12/07/21 0438  WBC 8.8 7.6  NEUTROABS 5.4  --   HGB 11.7* 11.1*  HCT 38.1 34.9*  MCV 79.9* 78.1*  PLT 345 286    CBG: Recent Labs  Lab 12/06/21 0733  GLUCAP 88     IMAGING STUDIES EEG adult  Result Date: 12/06/2021 Lora Havens, MD     12/06/2021  2:55 PM Patient Name: [name] [name] MRN: [id number] Epilepsy Attending: Lora Havens Referring Physician/Provider: Christene Slates, MD Date: 12/06/2021 Duration: 23.34 mins Patient history: 31yo F with seizure like activity. EEG to evaluate for seizure. Level of alertness: Awake, asleep AEDs during EEG study: None Technical aspects: This EEG study  was done with scalp electrodes positioned according to the 10-20 International system of electrode placement. Electrical activity was reviewed with band pass filter of 1-70Hz , sensitivity of 7 uV/mm, display speed of 91mm/sec with a 60Hz  notched filter applied as appropriate. EEG data  were recorded continuously and digitally stored.  Video monitoring was available and reviewed as appropriate. Description: The posterior dominant rhythm consists of 9 Hz activity of moderate voltage (25-35 uV) seen predominantly in posterior head regions, symmetric and reactive to eye opening and eye closing. Sleep was characterized by vertex waves, sleep spindles (12 to 14 Hz), maximal frontocentral region. Hyperventilation and photic stimulation were not performed.   IMPRESSION: This study is within normal limits. No seizures or epileptiform discharges were seen throughout the recording. A normal interictal EEG does not exclude the diagnosis of epilepsy.   MR BRAIN WO CONTRAST  Result Date: 12/06/2021 CLINICAL DATA:  Altered mental status.  Seizure-like activity. EXAM: MRI HEAD WITHOUT CONTRAST TECHNIQUE: Multiplanar, multiecho pulse sequences of the brain and surrounding structures were obtained without intravenous contrast. COMPARISON:  Head CT 12/06/2021 FINDINGS: The study is intermittently moderately motion degraded. Brain: There is no evidence of an acute infarct, intracranial hemorrhage, midline shift, or extra-axial fluid collection. There is a 15 x 8 x 7 mm uniformly intrinsically T1 hyperintense lesion in the posterior aspect of the pituitary gland without significant mass effect. The brain is otherwise unremarkable in signal. The ventricles and sulci are normal. A small cavum septum pellucidum is incidentally noted. Vascular: Major intracranial vascular flow voids are preserved. Skull and upper cervical spine: Unremarkable bone marrow signal. Sinuses/Orbits: Unremarkable orbits. Paranasal sinuses and mastoid air cells are clear. Other: None. IMPRESSION: 1. No acute intracranial abnormality. 2. 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 Endocrine function tests are recommended. Follow-up pituitary protocol MRI with and without  contrast should be performed in 6-12 months. This follows ACR consensus guidelines: Management of Incidental Pituitary Findings on CT, MRI and F18-FDG PET: A White Paper of the ACR Incidental Findings Committee. J Am Coll Radiol 2018; 152019. Electronically Signed   By: : [id number] M.D.   On: 12/06/2021 14:02   CT Head Wo Contrast  Result Date: 12/06/2021 CLINICAL DATA:  Seizure. EXAM: CT HEAD WITHOUT CONTRAST TECHNIQUE: Contiguous axial images were obtained from the base of the skull through the vertex without intravenous contrast. RADIATION DOSE REDUCTION: This exam was performed according to the departmental dose-optimization program which includes automated exposure control, adjustment of the mA and/or kV according to patient size and/or use of iterative reconstruction technique. COMPARISON:  04/19/2014 FINDINGS: Brain: No evidence of acute infarction, hemorrhage, hydrocephalus, extra-axial collection or mass lesion/mass effect. Vascular: No hyperdense vessel or unexpected calcification. Skull: Normal. Negative for fracture or focal lesion. Sinuses/Orbits: No acute finding. Other: None. IMPRESSION: No acute intracranial pathology. Electronically Signed   By: 06/19/2014 M.D.   On: 12/06/2021 07:47    DISCHARGE EXAMINATION: Vitals:   12/08/21 0323 12/08/21 0752 12/08/21 0756 12/08/21 1139  BP: (!) 138/96 (!) 154/97 (!) 151/99 134/87  Pulse: (!) 55 (!) 57  65  Resp: 16 16  20   Temp: 97.8 F (36.6 C) 99.3 F (37.4 C)  98.5 F (36.9 C)  TempSrc: Oral Oral  Oral  SpO2: 100% 100%  100%  Weight:      Height:       General appearance: Awake alert.  In no distress Resp: Clear to auscultation  bilaterally.  Normal effort Cardio: S1-S2 is normal regular.  No S3-S4.  No rubs murmurs or bruit GI: Abdomen is soft.  Nontender nondistended.  Bowel sounds are present normal.  No masses organomegaly    DISPOSITION: Jail  Discharge Instructions     Call MD for:  difficulty breathing,  headache or visual disturbances   Complete by: As directed    Call MD for:  extreme fatigue   Complete by: As directed    Call MD for:  persistant dizziness or light-headedness   Complete by: As directed    Call MD for:  persistant nausea and vomiting   Complete by: As directed    Call MD for:  severe uncontrolled pain   Complete by: As directed    Call MD for:  temperature >100.4   Complete by: As directed    Diet general   Complete by: As directed    Discharge instructions   Complete by: As directed    Patient was incidentally found to have a pituitary cyst.  This will need endocrine workup in the outpatient setting.  This will also need a repeat MRI in 6 to 12 months.  You were cared for by a hospitalist during your hospital stay. If you have any questions about your discharge medications or the care you received while you were in the hospital after you are discharged, you can call the unit and asked to speak with the hospitalist on call if the hospitalist that took care of you is not available. Once you are discharged, your primary care physician will handle any further medical issues. Please note that NO REFILLS for any discharge medications will be authorized once you are discharged, as it is imperative that you return to your primary care physician (or establish a relationship with a primary care physician if you do not have one) for your aftercare needs so that they can reassess your need for medications and monitor your lab values. If you do not have a primary care physician, you can call [phone] for a physician referral.   Increase activity slowly   Complete by: As directed           Allergies as of 12/08/2021       Reactions   Coconut Fatty Acids Anaphylaxis, Hives   Acetaminophen Nausea Only        Medication List     TAKE these medications    acetaminophen 325 MG tablet Commonly known as: TYLENOL Take 650 mg by mouth 3 (three) times daily as needed for  headache, mild pain or fever. 7 day course. Start date of 11.17.23, End Date of 11.24.23   cephALEXin 500 MG capsule Commonly known as: KEFLEX Take 1 capsule (500 mg total) by mouth every 8 (eight) hours for 3 days.   folic acid 1 MG tablet Commonly known as: FOLVITE Take 1 tablet (1 mg total) by mouth daily. Start taking on: December 09, 2021   loperamide 2 MG capsule Commonly known as: IMODIUM Take 2 mg by mouth 3 (three) times daily as needed for diarrhea or loose stools. 7 day course. Start date of 11.17.23, End Date of 11.24.23   meclizine 25 MG tablet Commonly known as: ANTIVERT Take 1 tablet (25 mg total) by mouth 3 (three) times daily as needed for dizziness. What changed: additional instructions   QUEtiapine 50 MG tablet Commonly known as: SEROQUEL Take 1 tablet (50 mg total) by mouth 2 (two) times daily.   thiamine 100 MG tablet  Commonly known as: Vitamin B-1 Take 1 tablet (100 mg total) by mouth daily. Start taking on: December 09, 2021           TOTAL DISCHARGE TIME: 8 mins  [employer] operational officer on [email or web address]  12/08/2021, 1:17 PM

## 2021-12-08 NOTE — Plan of Care (Signed)

## 2021-12-08 NOTE — TOC Transition Note (Signed)
Transition of Care [facility]) - CM/SW Discharge Note   Patient Details  Name: [name] [name] MRN: [id number] Date of Birth: September 14, 1990  Transition of Care [facility]) CM/SW Contact:  Kermit Balo, RN Phone Number: 12/08/2021, 2:36 PM   Clinical Narrative:    Pt is [facility] back to jail today. Guard at the bedside to arrange transportation. Printed prescriptions to accompany pt.    Final next level of care: Other (comment) (Jail) Barriers to Discharge: No Barriers Identified   Patient Goals and CMS Choice        Discharge Placement                       Discharge Plan and Services   Discharge Planning Services: CM Consult                                 Social Determinants of Health (SDOH) Interventions     Readmission Risk Interventions     No data to display

## 2022-09-21 ENCOUNTER — Other Ambulatory Visit: Payer: Self-pay

## 2022-09-21 ENCOUNTER — Observation Stay (HOSPITAL_COMMUNITY)
Admission: EM | Admit: 2022-09-21 | Discharge: 2022-09-22 | Disposition: A | Discharge status: Home / Self Care | Payer: Medicaid Other | Attending: Emergency Medicine | Admitting: Emergency Medicine

## 2022-09-21 ENCOUNTER — Emergency Department (HOSPITAL_COMMUNITY): Payer: Medicaid Other

## 2022-09-21 DIAGNOSIS — E876 Hypokalemia: Secondary | ICD-10-CM | POA: Diagnosis not present

## 2022-09-21 DIAGNOSIS — T40601A Poisoning by unspecified narcotics, accidental (unintentional), initial encounter: Secondary | ICD-10-CM | POA: Diagnosis not present

## 2022-09-21 DIAGNOSIS — G9341 Metabolic encephalopathy: Principal | ICD-10-CM | POA: Insufficient documentation

## 2022-09-21 DIAGNOSIS — R4182 Altered mental status, unspecified: Secondary | ICD-10-CM | POA: Diagnosis present

## 2022-09-21 DIAGNOSIS — R112 Nausea with vomiting, unspecified: Secondary | ICD-10-CM

## 2022-09-21 DIAGNOSIS — F17210 Nicotine dependence, cigarettes, uncomplicated: Secondary | ICD-10-CM | POA: Diagnosis not present

## 2022-09-21 LAB — CBC WITH DIFFERENTIAL/PLATELET
Abs Immature Granulocytes: 0.01 10*3/uL (ref 0.00–0.07)
Basophils Absolute: 0.1 10*3/uL (ref 0.0–0.1)
Basophils Relative: 1 %
Eosinophils Absolute: 0 10*3/uL (ref 0.0–0.5)
Eosinophils Relative: 0 %
HCT: 38 % (ref 36.0–46.0)
Hemoglobin: 12.2 g/dL (ref 12.0–15.0)
Immature Granulocytes: 0 %
Lymphocytes Relative: 20 %
Lymphs Abs: 1 10*3/uL (ref 0.7–4.0)
MCH: 26.2 pg (ref 26.0–34.0)
MCHC: 32.1 g/dL (ref 30.0–36.0)
MCV: 81.7 fL (ref 80.0–100.0)
Monocytes Absolute: 0.4 10*3/uL (ref 0.1–1.0)
Monocytes Relative: 7 %
Neutro Abs: 3.7 10*3/uL (ref 1.7–7.7)
Neutrophils Relative %: 72 %
Platelets: 363 10*3/uL (ref 150–400)
RBC: 4.65 MIL/uL (ref 3.87–5.11)
RDW: 15.2 % (ref 11.5–15.5)
WBC: 5.2 10*3/uL (ref 4.0–10.5)
nRBC: 0 % (ref 0.0–0.2)

## 2022-09-21 LAB — SALICYLATE LEVEL: Salicylate Lvl: <7 mg/dL — ABNORMAL LOW (ref 7.0–30.0)

## 2022-09-21 LAB — COMPREHENSIVE METABOLIC PANEL
ALT: 21 U/L (ref 0–44)
AST: 30 U/L (ref 15–41)
Albumin: 4 g/dL (ref 3.5–5.0)
Alkaline Phosphatase: 83 U/L (ref 38–126)
Anion gap: 9 (ref 5–15)
BUN: 14 mg/dL (ref 6–20)
CO2: 25 mmol/L (ref 22–32)
Calcium: 9 mg/dL (ref 8.9–10.3)
Chloride: 101 mmol/L (ref 98–111)
Creatinine, Ser: 0.54 mg/dL (ref 0.44–1.00)
GFR, Estimated: >60 mL/min (ref >60)
Glucose, Bld: 107 mg/dL — ABNORMAL HIGH (ref 70–99)
Potassium: 3.1 mmol/L — ABNORMAL LOW (ref 3.5–5.1)
Sodium: 135 mmol/L (ref 135–145)
Total Bilirubin: 0.4 mg/dL (ref 0.3–1.2)
Total Protein: 7.6 g/dL (ref 6.5–8.1)

## 2022-09-21 LAB — ETHANOL: Alcohol, Ethyl (B): <10 mg/dL (ref <10)

## 2022-09-21 LAB — CBG MONITORING, ED: Glucose-Capillary: 99 mg/dL (ref 70–99)

## 2022-09-21 LAB — HCG, SERUM, QUALITATIVE: Preg, Serum: NEGATIVE

## 2022-09-21 LAB — ACETAMINOPHEN LEVEL: Acetaminophen (Tylenol), Serum: <10 ug/mL — ABNORMAL LOW (ref 10–30)

## 2022-09-21 MED ORDER — SODIUM CHLORIDE 0.9 % IV BOLUS
1000.0000 mL | Freq: Once | INTRAVENOUS | Status: AC
  Administered 2022-09-21: 1000 mL via INTRAVENOUS

## 2022-09-21 MED ORDER — ONDANSETRON HCL 4 MG/2ML IJ SOLN
4.0000 mg | Freq: Once | INTRAMUSCULAR | Status: AC
  Administered 2022-09-21: 4 mg via INTRAVENOUS
  Filled 2022-09-21: qty 2

## 2022-09-21 MED ORDER — KETOROLAC TROMETHAMINE 30 MG/ML IJ SOLN
30.0000 mg | Freq: Once | INTRAMUSCULAR | Status: AC
  Administered 2022-09-21: 30 mg via INTRAVENOUS
  Filled 2022-09-21: qty 1

## 2022-09-21 MED ORDER — SODIUM CHLORIDE 0.9 % IV SOLN: INTRAVENOUS | Status: DC

## 2022-09-21 MED ORDER — POTASSIUM CHLORIDE 10 MEQ/100ML IV SOLN
10.0000 meq | Freq: Once | INTRAVENOUS | Status: AC
  Administered 2022-09-21: 10 meq via INTRAVENOUS
  Filled 2022-09-21: qty 100

## 2022-09-21 NOTE — ED Provider Notes (Signed)
Care assumed form Dr. Particia Nearing, patient with accidental overdose, waiting for her to regain normal mental status.   5:07 AM Patient has had ongoing problems with vomiting in spite of ondansetron.  I have given her a dose of metoclopramide.  I have reevaluated her and she is oriented to person and place but not time and she is not able to maintain a conversation without drifting back to sleep.  She has been observed for over 12 hours at this point and is not showing signs of regaining her baseline mentation.  Therefore, I have paged the hospitalist to admit for observation.  I have discussed case with Dr. Margo Aye of [facility], who agrees to admit the patient.   Dione Booze, MD 09/22/22 2257

## 2022-09-21 NOTE — ED Notes (Addendum)
Pt again vomited on the floor and bed less than 2 minutes after the RN changed the linen and being informed several time to use the emesis bag.

## 2022-09-21 NOTE — ED Notes (Signed)
Pt yelling out loud for for pain meds due to back pain. Pt educated on expected behavior within the ED. ED Dr. Thomasene Ripple for antiinflammatory meds.

## 2022-09-21 NOTE — ED Notes (Signed)
Pts bed linen was changed again by the RN and given a emesis bag after not using the emesis bag given to her after the other tech given her a emesis bag during the initial bed linen changed.

## 2022-09-21 NOTE — ED Triage Notes (Addendum)
Pt arrives via [facility] from an extended stay for initial report of unresponsiveness, being found on the stairs with vomit nearby. EMS unsure of possible ingestion. 20g Lt AC via EMS. No medical intervention due to unknown cause and stable vitals. On arrival pt admits to using fentanyl. Pt able to follow commands and answer questions after repeated stimulation.

## 2022-09-21 NOTE — ED Notes (Signed)
Pt still continues to vomit on the floor.

## 2022-09-21 NOTE — ED Notes (Signed)
Pt is now vomiting everywhere.

## 2022-09-21 NOTE — ED Provider Notes (Signed)
[place] EMERGENCY DEPARTMENT AT [facility] Provider Note   CSN: [id number] Arrival date & time: 09/21/22  1645     History  Chief Complaint  Patient presents with   Drug Overdose    [name] [name] is a 32 y.o. female.  Pt is a 31 yo female with pmhx significant for polysubstance abuse.  Pt was found in the stairwell of a local extended stay hotel unresponsive.  There was emesis near patient.  Pt was responsive for EMS and told them that she used fentanyl.  She is a very poor historian and is not answering any of my questions.       Home Medications Prior to Admission medications   Medication Sig Start Date End Date Taking? Authorizing Provider  acetaminophen (TYLENOL) 325 MG tablet Take 650 mg by mouth 3 (three) times daily as needed for headache, mild pain or fever. 7 day course. Start date of 11.17.23, End Date of 11.24.23    [provider]  folic acid (FOLVITE) 1 MG tablet Take 1 tablet (1 mg total) by mouth daily. 12/09/21   Osvaldo Shipper, MD  loperamide (IMODIUM) 2 MG capsule Take 2 mg by mouth 3 (three) times daily as needed for diarrhea or loose stools. 7 day course. Start date of 11.17.23, End Date of 11.24.23    [provider]  meclizine (ANTIVERT) 25 MG tablet Take 1 tablet (25 mg total) by mouth 3 (three) times daily as needed for dizziness. Patient taking differently: Take 25 mg by mouth 3 (three) times daily as needed for dizziness. 7 day course. Start date of 11.17.23, End Date of 11.24.23 03/22/20   Molpus, Jonny Ruiz, MD  QUEtiapine (SEROQUEL) 50 MG tablet Take 1 tablet (50 mg total) by mouth 2 (two) times daily. 12/08/21   Osvaldo Shipper, MD  thiamine (VITAMIN B-1) 100 MG tablet Take 1 tablet (100 mg total) by mouth daily. 12/09/21   Osvaldo Shipper, MD      Allergies    Coconut fatty acids and Acetaminophen    Review of Systems   Review of Systems  Unable to perform ROS: Mental status change  All other systems reviewed and are  negative.   Physical Exam Updated Vital Signs BP (!) 160/88   Pulse (!) 50   Temp 98.6 F (37 C)   Resp 16   SpO2 100%  Physical Exam Vitals and nursing note reviewed.  HENT:     Head: Normocephalic and atraumatic.     Right Ear: External ear normal.     Left Ear: External ear normal.     Nose: Nose normal.     Mouth/Throat:     Mouth: Mucous membranes are dry.  Eyes:     Conjunctiva/sclera: Conjunctivae normal.     Pupils: Pupils are equal, round, and reactive to light.  Cardiovascular:     Rate and Rhythm: Normal rate and regular rhythm.     Pulses: Normal pulses.     Heart sounds: Normal heart sounds.  Pulmonary:     Effort: Pulmonary effort is normal.     Breath sounds: Normal breath sounds.  Abdominal:     General: Abdomen is flat. Bowel sounds are normal.     Palpations: Abdomen is soft.  Musculoskeletal:        General: Normal range of motion.     Cervical back: Normal range of motion and neck supple.  Skin:    General: Skin is warm.     Capillary Refill: Capillary refill  takes less than 2 seconds.  Neurological:     Mental Status: She is alert.     Comments: Pt will awaken to verbal stimuli.  She is moving all 4 extremities.  She is flinging herself around the bed.  She is not answering questions, but tells me that she hurts all over.  Speech is clear.  Psychiatric:        Speech: Speech is delayed.        Behavior: Behavior is agitated.     ED Results / Procedures / Treatments   Labs (all labs ordered are listed, but only abnormal results are displayed) Labs Reviewed  COMPREHENSIVE METABOLIC PANEL - Abnormal; Notable for the following components:      Result Value   Potassium 3.1 (*)    Glucose, Bld 107 (*)    All other components within normal limits  SALICYLATE LEVEL - Abnormal; Notable for the following components:   Salicylate Lvl <7.0 (*)    All other components within normal limits  ACETAMINOPHEN LEVEL - Abnormal; Notable for the following  components:   Acetaminophen (Tylenol), Serum <10 (*)    All other components within normal limits  ETHANOL  CBC WITH DIFFERENTIAL/PLATELET  HCG, SERUM, QUALITATIVE  RAPID URINE DRUG SCREEN, HOSP PERFORMED  URINALYSIS, ROUTINE W REFLEX MICROSCOPIC  MAGNESIUM  CBG MONITORING, ED    EKG None  Radiology DG Chest Portable 1 View  Result Date: 09/21/2022 CLINICAL DATA:  Unresponsive.  Possible overdose. EXAM: PORTABLE CHEST 1 VIEW COMPARISON:  April 19, 2014 FINDINGS: The heart size and mediastinal contours are within normal limits. Both lungs are clear. The visualized skeletal structures are unremarkable. IMPRESSION: No active disease. Electronically Signed   By: Aram Candela M.D.   On: 09/21/2022 18:18    Procedures Procedures    Medications Ordered in ED Medications  sodium chloride 0.9 % bolus 1,000 mL (0 mLs Intravenous Stopped 09/21/22 1900)    And  0.9 %  sodium chloride infusion ( Intravenous New Bag/Given 09/21/22 1722)  ondansetron (ZOFRAN) injection 4 mg (has no administration in time range)  ondansetron (ZOFRAN) injection 4 mg (4 mg Intravenous Given 09/21/22 1715)  potassium chloride 10 mEq in 100 mL IVPB (0 mEq Intravenous Stopped 09/21/22 2059)  ketorolac (TORADOL) 30 MG/ML injection 30 mg (30 mg Intravenous Given 09/21/22 1924)    ED Course/ Medical Decision Making/ A&P                                 Medical Decision Making Amount and/or Complexity of Data Reviewed Labs: ordered. Radiology: ordered.  Risk Prescription drug management.   This patient presents to the ED for concern of ams, this involves an extensive number of treatment options, and is a complaint that carries with it a high risk of complications and morbidity.  The differential diagnosis includes drug od, infection, electrolyte abn   Co morbidities that complicate the patient evaluation  Polysubstance abuse   Additional history obtained:  Additional history obtained from epic chart  review External records from outside source obtained and reviewed including EMS report   Lab Tests:  I Ordered, and personally interpreted labs.  The pertinent results include:  cbc nl, cmp nl other than k low at 3.1, salicylate neg, acet neg, etoh neg, preg neg   Imaging Studies ordered:  I ordered imaging studies including cxr  I independently visualized and interpreted imaging which showed No active disease.  I agree with the radiologist interpretation   Cardiac Monitoring:  The patient was maintained on a cardiac monitor.  I personally viewed and interpreted the cardiac monitored which showed an underlying rhythm of: nsr   Medicines ordered and prescription drug management:  I ordered medication including ivfs/zofran  for sx  Reevaluation of the patient after these medicines showed that the patient improved I have reviewed the patients home medicines and have made adjustments as needed   Test Considered:  ct   Critical Interventions:  ivfs   Problem List / ED Course:  Opiate overdose:  likely unintentional.  She is oxygenating well, so has not required narcan. She has been sleeping since arrival with intermittent episodes of vomiting.  She will need to metabolize and wake up and eat/drink prior to d/c.     Reevaluation:  After the interventions noted above, I reevaluated the patient and found that they have :improved   Social Determinants of Health:  Lives at hotel   Dispostion:  Pending at shift change.        Final Clinical Impression(s) / ED Diagnoses Final diagnoses:  Opiate overdose, accidental or unintentional, initial encounter (HCC)  Nausea and vomiting, unspecified vomiting type    Rx / DC Orders ED Discharge Orders     None         Jacalyn Lefevre, MD 09/21/22 2314

## 2022-09-21 NOTE — ED Notes (Addendum)
Pt. Vomited on bed linen after given an emesis bag. pt. Cleaned up and new linen and gown applied.pt. alert and oriented.

## 2022-09-21 NOTE — ED Notes (Signed)
Pt awoke, pulled all cords off and walked to bathroom on own ability. Gait steady.

## 2022-09-22 DIAGNOSIS — R4182 Altered mental status, unspecified: Secondary | ICD-10-CM | POA: Diagnosis present

## 2022-09-22 LAB — CBC
HCT: 41.7 % (ref 36.0–46.0)
Hemoglobin: 13.3 g/dL (ref 12.0–15.0)
MCH: 25.6 pg — ABNORMAL LOW (ref 26.0–34.0)
MCHC: 31.9 g/dL (ref 30.0–36.0)
MCV: 80.3 fL (ref 80.0–100.0)
Platelets: 372 10*3/uL (ref 150–400)
RBC: 5.19 MIL/uL — ABNORMAL HIGH (ref 3.87–5.11)
RDW: 15.5 % (ref 11.5–15.5)
WBC: 6.6 10*3/uL (ref 4.0–10.5)
nRBC: 0 % (ref 0.0–0.2)

## 2022-09-22 LAB — COMPREHENSIVE METABOLIC PANEL
ALT: 24 U/L (ref 0–44)
AST: 39 U/L (ref 15–41)
Albumin: 4 g/dL (ref 3.5–5.0)
Alkaline Phosphatase: 84 U/L (ref 38–126)
Anion gap: 13 (ref 5–15)
BUN: 13 mg/dL (ref 6–20)
CO2: 24 mmol/L (ref 22–32)
Calcium: 9.2 mg/dL (ref 8.9–10.3)
Chloride: 99 mmol/L (ref 98–111)
Creatinine, Ser: 0.7 mg/dL (ref 0.44–1.00)
GFR, Estimated: >60 mL/min (ref >60)
Glucose, Bld: 89 mg/dL (ref 70–99)
Potassium: 3.3 mmol/L — ABNORMAL LOW (ref 3.5–5.1)
Sodium: 136 mmol/L (ref 135–145)
Total Bilirubin: 0.3 mg/dL (ref 0.3–1.2)
Total Protein: 8 g/dL (ref 6.5–8.1)

## 2022-09-22 LAB — PHOSPHORUS: Phosphorus: 3.5 mg/dL (ref 2.5–4.6)

## 2022-09-22 LAB — MAGNESIUM
Magnesium: 2 mg/dL (ref 1.7–2.4)
Magnesium: 2 mg/dL (ref 1.7–2.4)

## 2022-09-22 MED ORDER — PROCHLORPERAZINE EDISYLATE 10 MG/2ML IJ SOLN: 5.0000 mg | Freq: Four times a day (QID) | INTRAMUSCULAR | Status: DC | PRN

## 2022-09-22 MED ORDER — NALOXONE HCL 0.4 MG/ML IJ SOLN: 0.4000 mg | INTRAMUSCULAR | Status: DC

## 2022-09-22 MED ORDER — NALOXONE HCL 0.4 MG/ML IJ SOLN: 0.4000 mg | INTRAMUSCULAR | Status: DC | PRN

## 2022-09-22 MED ORDER — METOCLOPRAMIDE HCL 5 MG/ML IJ SOLN
10.0000 mg | Freq: Once | INTRAMUSCULAR | Status: AC
  Administered 2022-09-22: 10 mg via INTRAVENOUS
  Filled 2022-09-22: qty 2

## 2022-09-22 MED ORDER — POTASSIUM CHLORIDE IN NACL 40-0.9 MEQ/L-% IV SOLN
INTRAVENOUS | Status: DC
  Filled 2022-09-22: qty 1000

## 2022-09-22 MED ORDER — ACETAMINOPHEN 325 MG PO TABS: 650.0000 mg | ORAL_TABLET | Freq: Four times a day (QID) | ORAL | Status: DC | PRN

## 2022-09-22 MED ORDER — ENOXAPARIN SODIUM 40 MG/0.4ML IJ SOSY: 40.0000 mg | PREFILLED_SYRINGE | INTRAMUSCULAR | Status: DC

## 2022-09-22 MED ORDER — POLYETHYLENE GLYCOL 3350 17 G PO PACK: 17.0000 g | PACK | Freq: Every day | ORAL | Status: DC | PRN

## 2022-09-22 NOTE — ED Notes (Addendum)
Unable to obtain urine specimen due to patient urinating on the floor. Pt states "I couldn't hold it." Pt educated on use of call light and to call ED staff if she needs to use the bathroom.

## 2022-09-22 NOTE — ED Notes (Signed)
Instead of using the call bell, pt got up the end of the bed and urinated on the bed and floor. Pt then proceed to lay back in bed.

## 2022-09-22 NOTE — H&P (Addendum)
History and Physical  [name] [name] RUE:[id number] DOB: [birth year] DOA: 09/21/2022  Referring physician: Dr. Preston Fleeting, EDP  PCP: Patient, No Pcp Per  Outpatient Specialists: None Patient coming from: Home  Chief Complaint: Accidental drug overdose.  HPI: [name] [name] is a 32 y.o. female with medical history significant for polysubstance abuse, including amphetamines, cocaine, and THC, was found in the stairwell of a local extended stay hotel unresponsive.  EMS was activated.  Upon EMS arrival, the patient was responsive and told EMS that she uses fentanyl.  She received Narcan en route.  In the ED, hypersomnolent.  She received multiple rounds of IV antiemetics but continued to have nausea and vomiting in the ED.  Currently on IV fluid hydration.  Due to persistent symptomatology, TRH, hospitalist service, was asked to admit.  The patient is unable to provide a history due to altered mental status.  ED Course: Tmax 98.6.  BP 102/88, pulse 62, respiratory 14, O2 saturation 95 to 100% on room air.  Lab studies unremarkable except for hypokalemia with potassium of 3.1.  Review of Systems: Review of systems as noted in the HPI. All other systems reviewed and are negative.   Past Medical History:  Diagnosis Date   Cervical cyst    Menorrhagia    Polysubstance abuse (HCC)    Past Surgical History:  Procedure Laterality Date   DILATION AND CURETTAGE OF UTERUS     TAB     Social History:  reports that she has been smoking cigarettes. She has a 4 pack-year smoking history. She has never used smokeless tobacco. She reports current alcohol use. She reports current drug use. Drugs: Marijuana and Methamphetamines.   Allergies  Allergen Reactions   Coconut Fatty Acids Anaphylaxis and Hives   Acetaminophen Nausea Only    Family History  Problem Relation Age of Onset   Stroke Mother    Cancer Maternal Grandmother    Other Neg Hx       Prior to Admission medications   Medication Sig  Start Date End Date Taking? Authorizing Provider  acetaminophen (TYLENOL) 325 MG tablet Take 650 mg by mouth 3 (three) times daily as needed for headache, mild pain or fever. 7 day course. Start date of 11.17.23, End Date of 11.24.23    [provider]  folic acid (FOLVITE) 1 MG tablet Take 1 tablet (1 mg total) by mouth daily. 12/09/21   Osvaldo Shipper, MD  loperamide (IMODIUM) 2 MG capsule Take 2 mg by mouth 3 (three) times daily as needed for diarrhea or loose stools. 7 day course. Start date of 11.17.23, End Date of 11.24.23    [provider]  meclizine (ANTIVERT) 25 MG tablet Take 1 tablet (25 mg total) by mouth 3 (three) times daily as needed for dizziness. Patient taking differently: Take 25 mg by mouth 3 (three) times daily as needed for dizziness. 7 day course. Start date of 11.17.23, End Date of 11.24.23 03/22/20   Molpus, Jonny Ruiz, MD  QUEtiapine (SEROQUEL) 50 MG tablet Take 1 tablet (50 mg total) by mouth 2 (two) times daily. 12/08/21   Osvaldo Shipper, MD  thiamine (VITAMIN B-1) 100 MG tablet Take 1 tablet (100 mg total) by mouth daily. 12/09/21   Osvaldo Shipper, MD    Physical Exam: BP (!) 116/52   Pulse 62   Temp 98.6 F (37 C) (Axillary)   Resp 14   SpO2 100%   General: 32 y.o. year-old female frail-appearing, unkept, in no acute distress.  Hypersomnolent.. Cardiovascular: Regular  rate and rhythm with no rubs or gallops.  No thyromegaly or JVD noted.  No lower extremity edema. 2/4 pulses in all 4 extremities. Respiratory: Clear to auscultation with no wheezes or rales.  Abdomen: Soft nontender nondistended with normal bowel sounds x4 quadrants. Muskuloskeletal: No cyanosis, clubbing or edema noted bilaterally Neuro: CN II-XII intact, strength, sensation, reflexes Skin: No ulcerative lesions noted or rashes Psychiatry: Unable to assess judgment and mood due to hypersomnolence.         Labs on Admission:  Basic Metabolic Panel: Recent Labs  Lab  09/21/22 1708  NA 135  K 3.1*  CL 101  CO2 25  GLUCOSE 107*  BUN 14  CREATININE 0.54  CALCIUM 9.0  MG 2.0   Liver Function Tests: Recent Labs  Lab 09/21/22 1708  AST 30  ALT 21  ALKPHOS 83  BILITOT 0.4  PROT 7.6  ALBUMIN 4.0   No results for input(s): "LIPASE", "AMYLASE" in the last 168 hours. No results for input(s): "AMMONIA" in the last 168 hours. CBC: Recent Labs  Lab 09/21/22 1708  WBC 5.2  NEUTROABS 3.7  HGB 12.2  HCT 38.0  MCV 81.7  PLT 363   Cardiac Enzymes: No results for input(s): "CKTOTAL", "CKMB", "CKMBINDEX", "TROPONINI" in the last 168 hours.  BNP (last 3 results) No results for input(s): "BNP" in the last 8760 hours.  ProBNP (last 3 results) No results for input(s): "PROBNP" in the last 8760 hours.  CBG: Recent Labs  Lab 09/21/22 1655  GLUCAP 99    Radiological Exams on Admission: DG Chest Portable 1 View  Result Date: 09/21/2022 CLINICAL DATA:  Unresponsive.  Possible overdose. EXAM: PORTABLE CHEST 1 VIEW COMPARISON:  April 19, 2014 FINDINGS: The heart size and mediastinal contours are within normal limits. Both lungs are clear. The visualized skeletal structures are unremarkable. IMPRESSION: No active disease. Electronically Signed   By: Aram Candela M.D.   On: 09/21/2022 18:18    EKG: I independently viewed the EKG done and my findings are as followed: Normal sinus rhythm rate of 66.  Nonspecific ST-T changes.  QTc 437.  Assessment/Plan Present on Admission:  AMS (altered mental status)  Principal Problem:   AMS (altered mental status)  Acute metabolic encephalopathy in the setting of accidental fentanyl overdose IV Narcan PRN Reorient as needed Will need Narcan at discharge  Intractable nausea and vomiting, suspect related to her fentanyl overdose As needed IV antiemetics IV fluid hydration  Hypokalemia likely secondary to poor oral intake Serum potassium 3.1 Repleted intravenously. Obtain magnesium level Check  electrolytes and replete as indicated  Polysubstance abuse including amphetamines, THC, opiates, cocaine Obtain UDS When more awake consider polysubstance cessation counseling. TOC consulted to assist with providing resources   Time: 75 minutes.   DVT prophylaxis: Subcu Lovenox daily  Code Status: Full code, by default.  Family Communication: None at bedside  Disposition Plan: Admitted to progressive care unit  Consults called: None  Admission status: Observation status.   Status is: Observation    Darlin Drop MD [facility] Pager [phone]  If 7PM-7AM, please contact night-coverage [email or web address] Password [email or web address]  09/22/2022, 5:20 AM

## 2022-09-22 NOTE — ED Notes (Signed)
Pt. Soiled bed with vomit, although given an emesis bag to use, pt.refused and vomited on the floor and bed. Pt. Cleaned up with linen and new gown and chucks placed on bed. Pt. Uncooperative in helping techs to change her bed linens and gown. Pt. IV came out due to pt. thrashing around in the bed.

## 2022-09-22 NOTE — ED Notes (Signed)
Pt. Urinated on the floor, stood up in bed and did not use call bell nor call for help. Pt. Cleaned up with new linen and given emesis bag and the floor was mopped and chucks were put into place for cleaniness.This time asked for an emesis bag and still missed, pt ended up vomiting on themself.

## 2022-09-22 NOTE — TOC Initial Note (Signed)
Transition of Care [facility]) - Initial/Assessment Note    Patient Details  Name: [name] [name] MRN: [id number] Date of Birth: 17-Oct-1990  Transition of Care [facility]) CM/SW Contact:    Howell Rucks, RN Phone Number: 09/22/2022, 10:45 AM  Clinical Narrative:  Per bedside nurse, pt remains AMS, will complete initial assessment when pt more alert and oriented.                         Patient Goals and CMS Choice            Expected Discharge Plan and Services                                              Prior Living Arrangements/Services                       Activities of Daily Living      Permission Sought/Granted                  Emotional Assessment              Admission diagnosis:  Hypokalemia [E87.6] Opiate overdose, accidental or unintentional, initial encounter (HCC) [T40.601A] AMS (altered mental status) [R41.82] Nausea and vomiting, unspecified vomiting type [R11.2] Patient Active Problem List   Diagnosis Date Noted   AMS (altered mental status) 09/22/2022   Seizure-like activity (HCC) 12/06/2021   Tobacco abuse 05/01/2019   Leukocytosis 05/01/2019   Palpitations 05/01/2019   Acute on chronic blood loss anemia 04/30/2019   GBS (group B Streptococcus carrier), +RV culture, currently pregnant 12/21/2011   Polysubstance abuse (HCC) 12/21/2011   Supervision of normal pregnancy 12/12/2011   PCP:  Patient, No Pcp Per Pharmacy:   [facility] [address] [place] - [address] [place] [address] Phone: [phone] Fax: [phone]     Social Determinants of Health (SDOH) Social History: SDOH Screenings   Tobacco Use: High Risk (12/06/2021)   SDOH Interventions:     Readmission Risk Interventions     No data to display

## 2022-09-22 NOTE — Progress Notes (Signed)
Patient noted waking in the hall naked stating her ride was downstairs and she needed clothes because all of her clothes were wet. Patient dressed in disposable scrubs and escorted to main entrance for discharge.

## 2022-09-22 NOTE — ED Notes (Signed)
Pt continues to vomit on the bed and the floor even after being reminded to use emesis bag. Dr. Preston Fleeting updated on patient condition. New orders received. Medicated per MAR.

## 2022-09-22 NOTE — Discharge Summary (Signed)
Physician Discharge Summary  Clarity Wi RUE:[id number] DOB: [birth year] DOA: 09/21/2022  PCP: Patient, No Pcp Per  Admit date: 09/21/2022 Discharge date: 09/22/2022  Admitted From: Home Discharge disposition: Home  Recommendations at discharge:  Stop drug use   Brief narrative: [name] [name] is a 32 y.o. female with PMH significant for polysubstance abuse, including amphetamines, cocaine, and THC 9/4, patient was found unresponsive in the stairwell of a local extended stay hotel. Upon EMS arrival, the patient was responsive and told EMS that she uses fentanyl.  She received Narcan en route and was brought to the ED.  In the ED, patient was hypersomnolent, afebrile, hemodynamically stable, O2 sat 95 to 100% on room air She received multiple rounds of IV antiemetics but continued to have nausea and vomiting in the ED.   She was given IV hydration Tractable vomiting, patient was kept in observation to [facility]  Subjective: Patient was seen and examined earlier this morning. Patient opened eyes on command and said hi.  Fell back asleep and did not want to talk. I spoke with RN and planned to observe her for few more hours for improvement in mental status.  Earlier this afternoon, RN noted patient walking in the hallway stating her ride was downstairs and see needed clothes.  RN helped patient dressed in disposable scrubs and escorted to main entrance for discharge. I did not get a chance to examine her or reconcile her meds for formal discharge.  Assessment and plan: Accidental Fentanyl overdose Acute metabolic encephalopathy Mental status gradually improved.  Respiratory status stable  Intractable nausea and vomiting, suspect related to her fentanyl overdose Improved   Hypokalemia likely secondary to poor oral intake Serum potassium was 3.1.  Replacement given.  Repeat labs pending this morning  Polysubstance abuse  amphetamines, THC, opiates, cocaine Counseled to quit  Wounds:   -    Discharge Exam:   Vitals:   09/22/22 0400 09/22/22 0622 09/22/22 0630 09/22/22 1019  BP: (!) 116/52  (!) 144/102 (!) 157/97  Pulse: 62  74 66  Resp: 14  20 20   Temp:   98.9 F (37.2 C) 100 F (37.8 C)  TempSrc:   Oral Oral  SpO2: 100%  98% 100%  Weight:  52.6 kg    Height:  5\' 5"  (1.651 m)      Body mass index is 19.3 kg/m.  General exam: Young African-American female Skin: No rashes, lesions or ulcers. HEENT: Atraumatic, normocephalic, no obvious bleeding Lungs: Clear to auscultation bilaterally CVS: Regular rate and rhythm, no murmur GI/Abd soft, nontender, nondistended, bowel sound present CNS: Was somnolent earlier today.  Later woke up to normal mentation Psychiatry: Mood appropriate Extremities: No pedal edema, no calf tenderness  Follow ups:    Discharge Instructions:    Discharge Medications:     The results of significant diagnostics from this hospitalization (including imaging, microbiology, ancillary and laboratory) are listed below for reference.    Procedures and Diagnostic Studies:   DG Chest Portable 1 View  Result Date: 09/21/2022 CLINICAL DATA:  Unresponsive.  Possible overdose. EXAM: PORTABLE CHEST 1 VIEW COMPARISON:  April 19, 2014 FINDINGS: The heart size and mediastinal contours are within normal limits. Both lungs are clear. The visualized skeletal structures are unremarkable. IMPRESSION: No active disease. Electronically Signed   By: Aram Candela M.D.   On: 09/21/2022 18:18     Labs:   Basic Metabolic Panel: Recent Labs  Lab 09/21/22 1708 09/22/22 0756  NA 135 136  K 3.1*  3.3*  CL 101 99  CO2 25 24  GLUCOSE 107* 89  BUN 14 13  CREATININE 0.54 0.70  CALCIUM 9.0 9.2  MG 2.0 2.0  PHOS  --  3.5   GFR Estimated Creatinine Clearance: 82.3 mL/min (by C-G formula based on SCr of 0.7 mg/dL). Liver Function Tests: Recent Labs  Lab 09/21/22 1708 09/22/22 0756  AST 30 39  ALT 21 24  ALKPHOS 83 84  BILITOT 0.4 0.3   PROT 7.6 8.0  ALBUMIN 4.0 4.0   No results for input(s): "LIPASE", "AMYLASE" in the last 168 hours. No results for input(s): "AMMONIA" in the last 168 hours. Coagulation profile No results for input(s): "INR", "PROTIME" in the last 168 hours.  CBC: Recent Labs  Lab 09/21/22 1708 09/22/22 0756  WBC 5.2 6.6  NEUTROABS 3.7  --   HGB 12.2 13.3  HCT 38.0 41.7  MCV 81.7 80.3  PLT 363 372   Cardiac Enzymes: No results for input(s): "CKTOTAL", "CKMB", "CKMBINDEX", "TROPONINI" in the last 168 hours. BNP: Invalid input(s): "POCBNP" CBG: Recent Labs  Lab 09/21/22 1655  GLUCAP 99   D-Dimer No results for input(s): "DDIMER" in the last 72 hours. Hgb A1c No results for input(s): "HGBA1C" in the last 72 hours. Lipid Profile No results for input(s): "CHOL", "HDL", "LDLCALC", "TRIG", "CHOLHDL", "LDLDIRECT" in the last 72 hours. Thyroid function studies No results for input(s): "TSH", "T4TOTAL", "T3FREE", "THYROIDAB" in the last 72 hours.  Invalid input(s): "FREET3" Anemia work up No results for input(s): "VITAMINB12", "FOLATE", "FERRITIN", "TIBC", "IRON", "RETICCTPCT" in the last 72 hours. Microbiology No results found for this or any previous visit (from the past 240 hour(s)).  Time coordinating discharge: 25 minutes  Signed: Madelyn Tlatelpa  [facility] 09/22/2022, 1:46 PM

## 2022-09-22 NOTE — ED Notes (Signed)
ED TO INPATIENT HANDOFF REPORT  ED Nurse Name and Phone #: Claiborne Rigg Name/Age/Gender [name] [name] 32 y.o. female Room/Bed: WA17/WA17  Code Status   Code Status: Full Code  Home/SNF/Other Home Orientation: unable to assess; pt is uncooperative   Triage Complete: Triage complete  Chief Complaint AMS (altered mental status) [R41.82]  Triage Note Pt arrives via [facility] from an extended stay for initial report of unresponsiveness, being found on the stairs with vomit nearby. EMS unsure of possible ingestion. 20g Lt AC via EMS. No medical intervention due to unknown cause and stable vitals. On arrival pt admits to using fentanyl. Pt able to follow commands and answer questions after repeated stimulation.   Allergies Allergies  Allergen Reactions   Coconut Fatty Acids Anaphylaxis and Hives   Acetaminophen Nausea Only    Level of Care/Admitting Diagnosis ED Disposition     ED Disposition  Admit   Condition  --   Comment  Hospital Area: [facility] [[id number]]  Level of Care: Progressive [102]  Admit to Progressive based on following criteria: ACUTE MENTAL DISORDER-RELATED Drug/Alcohol Ingestion/Overdose/Withdrawal, Suicidal Ideation/attempt requiring safety sitter and < Q2h monitoring/assessments, moderate to severe agitation that is managed with medication/sitter, CIWA-Ar score < 20.  May place patient in [facility] at [facility] or [facility] if equivalent level of care is available:: Yes  Covid Evaluation: Asymptomatic - no recent exposure (last 10 days) testing not required  Diagnosis: AMS (altered mental status) [6045409]  Admitting Physician: Darlin Drop [[id number]]  Attending Physician: Darlin Drop [[id number]]          B Medical/Surgery History Past Medical History:  Diagnosis Date   Cervical cyst    Menorrhagia    Polysubstance abuse (HCC)    Past Surgical History:  Procedure Laterality Date   DILATION AND CURETTAGE OF  UTERUS     TAB      A IV Location/Drains/Wounds Patient Lines/Drains/Airways Status     Active Line/Drains/Airways     Name Placement date Placement time Site Days   Peripheral IV 09/21/22 20 G 1" Anterior;Left;Proximal Forearm 09/21/22  1650  Forearm  1            Intake/Output Last 24 hours  Intake/Output Summary (Last 24 hours) at 09/22/2022 0522 Last data filed at 09/21/2022 2059 Gross per 24 hour  Intake 1100 ml  Output --  Net 1100 ml    Labs/Imaging Results for orders placed or performed during the hospital encounter of 09/21/22 (from the past 48 hour(s))  CBG monitoring, ED     Status: None   Collection Time: 09/21/22  4:55 PM  Result Value Ref Range   Glucose-Capillary 99 70 - 99 mg/dL    Comment: Glucose reference range applies only to samples taken after fasting for at least 8 hours.  Comprehensive metabolic panel     Status: Abnormal   Collection Time: 09/21/22  5:08 PM  Result Value Ref Range   Sodium 135 135 - 145 mmol/L   Potassium 3.1 (L) 3.5 - 5.1 mmol/L   Chloride 101 98 - 111 mmol/L   CO2 25 22 - 32 mmol/L   Glucose, Bld 107 (H) 70 - 99 mg/dL    Comment: Glucose reference range applies only to samples taken after fasting for at least 8 hours.   BUN 14 6 - 20 mg/dL   Creatinine, Ser 3.08 0.44 - 1.00 mg/dL   Calcium 9.0 8.9 - 65.7 mg/dL   Total Protein 7.6 6.5 -  8.1 g/dL   Albumin 4.0 3.5 - 5.0 g/dL   AST 30 15 - 41 U/L   ALT 21 0 - 44 U/L   Alkaline Phosphatase 83 38 - 126 U/L   Total Bilirubin 0.4 0.3 - 1.2 mg/dL   GFR, Estimated >06 >30 mL/min    Comment: (NOTE) Calculated using the CKD-EPI Creatinine Equation (2021)    Anion gap 9 5 - 15    Comment: Performed at [facility], [address], [place], [place] [address]  Salicylate level     Status: Abnormal   Collection Time: 09/21/22  5:08 PM  Result Value Ref Range   Salicylate Lvl <7.0 (L) 7.0 - 30.0 mg/dL    Comment: Performed at [facility],  [address], [place], [place] [address]  Acetaminophen level     Status: Abnormal   Collection Time: 09/21/22  5:08 PM  Result Value Ref Range   Acetaminophen (Tylenol), Serum <10 (L) 10 - 30 ug/mL    Comment: (NOTE) Therapeutic concentrations vary significantly. A range of 10-30 ug/mL  may be an effective concentration for many patients. However, some  are best treated at concentrations outside of this range. Acetaminophen concentrations >150 ug/mL at 4 hours after ingestion  and >50 ug/mL at 12 hours after ingestion are often associated with  toxic reactions.  Performed at [facility], [address], [place], [place] [address]   Ethanol     Status: None   Collection Time: 09/21/22  5:08 PM  Result Value Ref Range   Alcohol, Ethyl (B) <10 <10 mg/dL    Comment: (NOTE) Lowest detectable limit for serum alcohol is 10 mg/dL.  For medical purposes only. Performed at [facility], [address] [place], [place] [address]   CBC WITH DIFFERENTIAL     Status: None   Collection Time: 09/21/22  5:08 PM  Result Value Ref Range   WBC 5.2 4.0 - 10.5 K/uL    Comment: WHITE COUNT CONFIRMED ON SMEAR   RBC 4.65 3.87 - 5.11 MIL/uL   Hemoglobin 12.2 12.0 - 15.0 g/dL   HCT 70.6 23.7 - 62.8 %   MCV 81.7 80.0 - 100.0 fL   MCH 26.2 26.0 - 34.0 pg   MCHC 32.1 30.0 - 36.0 g/dL   RDW 31.5 17.6 - 16.0 %   Platelets 363 150 - 400 K/uL    Comment: SPECIMEN CHECKED FOR CLOTS REPEATED TO VERIFY    nRBC 0.0 0.0 - 0.2 %   Neutrophils Relative % 72 %   Neutro Abs 3.7 1.7 - 7.7 K/uL   Lymphocytes Relative 20 %   Lymphs Abs 1.0 0.7 - 4.0 K/uL   Monocytes Relative 7 %   Monocytes Absolute 0.4 0.1 - 1.0 K/uL   Eosinophils Relative 0 %   Eosinophils Absolute 0.0 0.0 - 0.5 K/uL   Basophils Relative 1 %   Basophils Absolute 0.1 0.0 - 0.1 K/uL   Immature Granulocytes 0 %   Abs Immature Granulocytes 0.01 0.00 - 0.07 K/uL    Comment: Performed at [facility], [address], [place], [place] [address]  hCG, serum, qualitative     Status: None   Collection Time: 09/21/22  5:08 PM  Result Value Ref Range   Preg, Serum NEGATIVE NEGATIVE    Comment:        THE SENSITIVITY OF THIS METHODOLOGY IS >10 mIU/mL. Performed at [facility], [address] [employer], [place] [address]   Magnesium     Status: None   Collection Time: 09/21/22  5:08 PM  Result Value Ref Range   Magnesium 2.0 1.7 - 2.4 mg/dL    Comment: Performed at [facility], [address] [place], [place] [address]   DG Chest Portable 1 View  Result Date: 09/21/2022 CLINICAL DATA:  Unresponsive.  Possible overdose. EXAM: PORTABLE CHEST 1 VIEW COMPARISON:  April 19, 2014 FINDINGS: The heart size and mediastinal contours are within normal limits. Both lungs are clear. The visualized skeletal structures are unremarkable. IMPRESSION: No active disease. Electronically Signed   By: Aram Candela M.D.   On: 09/21/2022 18:18    Pending Labs Unresulted Labs (From admission, onward)     Start     Ordered   09/29/22 0500  Creatinine, serum  (enoxaparin (LOVENOX)    CrCl >/= 30 ml/min)  Weekly,   R     Comments: while on enoxaparin therapy    09/22/22 0518   09/22/22 0519  CBC  (enoxaparin (LOVENOX)    CrCl >/= 30 ml/min)  Once,   R       Comments: Baseline for enoxaparin therapy IF NOT ALREADY DRAWN.  Notify MD if PLT < 100 K.    09/22/22 0518   09/22/22 0519  Creatinine, serum  (enoxaparin (LOVENOX)    CrCl >/= 30 ml/min)  Once,   R       Comments: Baseline for enoxaparin therapy IF NOT ALREADY DRAWN.    09/22/22 0518   09/21/22 1701  Urine rapid drug screen (hosp performed)  Once,   STAT        09/21/22 1701   09/21/22 1701  Urinalysis, Routine w reflex microscopic -Urine, Clean Catch  Once,   URGENT       Question:  Specimen Source  Answer:  Urine, Clean Catch   09/21/22 1701            Vitals/Pain Today's Vitals    09/22/22 0118 09/22/22 0130 09/22/22 0200 09/22/22 0400  BP:  (!) 170/95 (!) 152/99 (!) 116/52  Pulse:  (!) 52 60 62  Resp:  18 18 14   Temp: 98.6 F (37 C)     TempSrc: Axillary     SpO2:  100% 100% 100%  PainSc:        Isolation Precautions No active isolations  Medications Medications  sodium chloride 0.9 % bolus 1,000 mL (0 mLs Intravenous Stopped 09/21/22 1900)    And  0.9 %  sodium chloride infusion ( Intravenous Rate/Dose Verify 09/21/22 2200)  enoxaparin (LOVENOX) injection 40 mg (has no administration in time range)  acetaminophen (TYLENOL) tablet 650 mg (has no administration in time range)  prochlorperazine (COMPAZINE) injection 5 mg (has no administration in time range)  polyethylene glycol (MIRALAX / GLYCOLAX) packet 17 g (has no administration in time range)  ondansetron (ZOFRAN) injection 4 mg (4 mg Intravenous Given 09/21/22 1715)  potassium chloride 10 mEq in 100 mL IVPB (0 mEq Intravenous Stopped 09/21/22 2059)  ketorolac (TORADOL) 30 MG/ML injection 30 mg (30 mg Intravenous Given 09/21/22 1924)  ondansetron (ZOFRAN) injection 4 mg (4 mg Intravenous Given 09/21/22 2323)  metoCLOPramide (REGLAN) injection 10 mg (10 mg Intravenous Given 09/22/22 0417)    Mobility Unable to assess     Focused Assessments GI - pt has been vomiting repeatedly throughout the night. See nurses notes. Pt is resposive to voice but uncooperative.   R Recommendations: See Admitting Provider  Note  Report given to:   Additional Notes: n/a

## 2022-09-22 NOTE — ED Notes (Addendum)
Pt continuously keeps vomiting on the floor, ed staff placed a towel on the floor. Pt then turn to vomit on the other side instead of using the emesis bag and the towel placed on the floor. Pt continuously refuses to use the emesis bag or things placed to prevent messes.

## 2022-09-22 NOTE — ED Notes (Signed)
STAT narcan dose not administered in ED due to not being clinically appropriate at this time. Pt is responsive and going through her own bag. No signs or respiratory depression/compromise at this time. Pt has continued to be uncooperative at this time. Pt pulled out 20G LAC IV. New line placed prior to patient being transported upstairs.
